# Patient Record
Sex: Female | Born: 1970
Health system: Southern US, Community
[De-identification: ages and names within clinical notes are randomized; demographics above are authoritative.]

## PROBLEM LIST (undated history)

## (undated) DIAGNOSIS — E063 Autoimmune thyroiditis: Secondary | ICD-10-CM

## (undated) DIAGNOSIS — D649 Anemia, unspecified: Secondary | ICD-10-CM

## (undated) DIAGNOSIS — I10 Essential (primary) hypertension: Secondary | ICD-10-CM

## (undated) DIAGNOSIS — E039 Hypothyroidism, unspecified: Secondary | ICD-10-CM

## (undated) HISTORY — PX: TUBAL LIGATION: SHX77

## (undated) HISTORY — PX: NASAL SINUS SURGERY: SHX719

## (undated) HISTORY — PX: CHOLECYSTECTOMY: SHX55

## (undated) HISTORY — PX: TONSILLECTOMY: SUR1361

---

## 2006-12-15 ENCOUNTER — Emergency Department: Payer: Self-pay

## 2006-12-16 ENCOUNTER — Ambulatory Visit: Payer: Self-pay | Admitting: Internal Medicine

## 2006-12-28 ENCOUNTER — Ambulatory Visit: Payer: Self-pay | Admitting: Gastroenterology

## 2007-05-03 ENCOUNTER — Ambulatory Visit: Payer: Self-pay | Admitting: Gastroenterology

## 2007-12-13 ENCOUNTER — Ambulatory Visit: Payer: Self-pay | Admitting: Internal Medicine

## 2007-12-15 ENCOUNTER — Other Ambulatory Visit: Payer: Self-pay

## 2007-12-15 ENCOUNTER — Emergency Department: Payer: Self-pay | Admitting: Emergency Medicine

## 2007-12-16 ENCOUNTER — Other Ambulatory Visit: Payer: Self-pay

## 2008-07-14 ENCOUNTER — Emergency Department: Payer: Self-pay | Admitting: Unknown Physician Specialty

## 2009-08-13 ENCOUNTER — Ambulatory Visit: Payer: Self-pay | Admitting: Gastroenterology

## 2009-11-12 ENCOUNTER — Ambulatory Visit: Payer: Self-pay | Admitting: Gastroenterology

## 2009-12-10 ENCOUNTER — Ambulatory Visit: Payer: Self-pay | Admitting: Surgery

## 2009-12-13 ENCOUNTER — Ambulatory Visit: Payer: Self-pay | Admitting: Surgery

## 2010-02-27 ENCOUNTER — Ambulatory Visit: Payer: Self-pay | Admitting: Obstetrics and Gynecology

## 2011-12-23 ENCOUNTER — Ambulatory Visit: Payer: Self-pay | Admitting: Obstetrics and Gynecology

## 2012-04-22 ENCOUNTER — Ambulatory Visit: Payer: Self-pay | Admitting: Internal Medicine

## 2012-04-22 LAB — HCG, QUANTITATIVE, PREGNANCY: Beta Hcg, Quant.: <1 m[IU]/mL — ABNORMAL LOW

## 2012-05-19 ENCOUNTER — Other Ambulatory Visit: Payer: Self-pay | Admitting: Family Medicine

## 2012-08-05 ENCOUNTER — Ambulatory Visit: Payer: Self-pay | Admitting: Internal Medicine

## 2012-08-11 ENCOUNTER — Ambulatory Visit: Payer: Self-pay | Admitting: Gastroenterology

## 2012-12-09 ENCOUNTER — Ambulatory Visit: Payer: Self-pay | Admitting: Gastroenterology

## 2012-12-23 ENCOUNTER — Ambulatory Visit: Payer: Self-pay | Admitting: Obstetrics and Gynecology

## 2013-03-08 ENCOUNTER — Ambulatory Visit: Payer: Self-pay | Admitting: Obstetrics and Gynecology

## 2013-03-08 LAB — BASIC METABOLIC PANEL
Anion Gap: 8 (ref 7–16)
BUN: 8 mg/dL (ref 7–18)
Calcium, Total: 9 mg/dL (ref 8.5–10.1)
Co2: 25 mmol/L (ref 21–32)
Creatinine: 0.68 mg/dL (ref 0.60–1.30)
EGFR (African American): >60
EGFR (Non-African Amer.): >60
Glucose: 97 mg/dL (ref 65–99)
Osmolality: 278 (ref 275–301)
Potassium: 3.8 mmol/L (ref 3.5–5.1)
Sodium: 140 mmol/L (ref 136–145)

## 2013-03-08 LAB — HEMOGLOBIN: HGB: 13.8 g/dL (ref 12.0–16.0)

## 2013-03-10 ENCOUNTER — Ambulatory Visit: Payer: Self-pay | Admitting: Obstetrics and Gynecology

## 2013-03-17 LAB — PATHOLOGY REPORT

## 2013-10-20 ENCOUNTER — Ambulatory Visit: Payer: Self-pay | Admitting: Unknown Physician Specialty

## 2013-12-11 ENCOUNTER — Other Ambulatory Visit: Payer: Self-pay | Admitting: Gastroenterology

## 2013-12-11 LAB — CLOSTRIDIUM DIFFICILE(ARMC)

## 2013-12-14 LAB — STOOL CULTURE

## 2013-12-25 ENCOUNTER — Ambulatory Visit: Payer: Self-pay | Admitting: Obstetrics and Gynecology

## 2013-12-27 ENCOUNTER — Ambulatory Visit: Payer: Self-pay | Admitting: Obstetrics and Gynecology

## 2014-03-24 ENCOUNTER — Ambulatory Visit: Payer: Self-pay | Admitting: Gastroenterology

## 2014-03-24 LAB — HCG, QUANTITATIVE, PREGNANCY: Beta Hcg, Quant.: <1 m[IU]/mL — ABNORMAL LOW

## 2014-12-21 NOTE — Op Note (Signed)
PATIENT NAME:  Jennifer Mclaughlin, Jennifer Mclaughlin MR#:  [id number] DATE OF BIRTH:  08/26/1971  DATE OF PROCEDURE:  03/10/2013  PREOPERATIVE DIAGNOSES: 1.  Menorrhagia.  2.  Elective sterilization.   POSTOPERATIVE DIAGNOSES:   1.  Menorrhagia.  2.  Elective sterilization.   PROCEDURES: 1.  Laparoscopic left salpingectomy.  2.  NovaSure endometrial ablation.   ANESTHESIA: General endotracheal anesthesia.   SURGEON:  Laverta Baltimore, MD   INDICATION:  A 44 year old gravida 4, para 1 patient with a long history of menorrhagia, with a negative workup. The patient is status post a right salpingectomy for an ectopic pregnancy in the past. She elects to have permanent sterilization.   DESCRIPTION OF PROCEDURE:  After adequate prep and drape, the patient's bladder was catheterized, yielding 100 mL of clear urine. A single-tooth tenaculum applied on the anterior cervix, and a Kahn cannula placed in the endocervical canal to be used for uterine manipulation during the procedure. A 12 mm infraumbilical incision was made after injecting with 0.5% Marcaine. The laparoscope was advanced into the abdominal cavity on direct visualization. The patient's abdomen was insufflated with carbon dioxide. A second port was placed 3 cm medial to the left anterior iliac spine. A #11 port was advanced into the abdominal cavity on direct visualization. Initial impression, patient had some scar tissue of the left colon to the left sidewall. The left fallopian tube appeared normal. There was surgical evidence of prior right salpingectomy. A third port site was advanced in the right lower quadrant. The 5 mm port was advanced into the abdominal cavity on direct visualization. Due to some technical difficulties with the Kleppinger, a Falope ring applier was brought up to the operative field, and the Falope ring was attempted to be placed on the isthmic portion of the fallopian tube. Unfortunately, this transected the fallopian tube, which required  placement of the Harmonic scalpel and removal of the left fallopian tube. Kleppinger was then fixed, and a small amount of cautery was used to control small bleeding at the salpingectomy site. The patient's abdomen was irrigated. Good hemostasis noted. Procedure was then switched to the vaginal area, where the Kahn cannula was removed. A weighted speculum was placed in the posterior vaginal vault. The cervix was dilated to a #17 Hanks dilator. The hysteroscope was advanced into the endometrial cavity. No pathology was identified. Based on the uterine sound of 8 cm and the cervical length of 4 cm, the cavity length was estimated at 4 cm. The NovaSure ablator was brought up to the operative field and advanced into the endometrial cavity. The array was opened, and this uterine width measured at 3.1 cm. A cavity assessment test was performed and passed, and based on the sounding length, cervical length, cervical width, power setting was 68. Ablation took place for 1 minute and 40 seconds without difficulty. The array was removed, and repeat hysteroscope showed normal charring effect. Good hemostasis was noted. The surgeon then returned to the  abdominal area. Again, laparoscope was performed with good hemostasis noted. Carbon dioxide was deflated, and the port sites, all trocars were removed. The infraumbilical and left lower port sites were closed with 2 layers, a 2-0 Vicryl layer and an interrupted 4-0 Vicryl skin layer. The right lower port site was closed with interrupted 4-0 Vicryl. Sterile dressings applied. There were no complications. Estimated blood loss 25 mL. Intraoperative fluids 1400 mL. The patient tolerated the procedure well, and was taken to the recovery room in good condition.    ____________________________  Boykin Nearing, MD tjs:mr D: 03/10/2013 14:44:02 ET T: 03/10/2013 21:22:23 ET JOB#: [id number]  cc: Boykin Nearing, MD, <Dictator> Boykin Nearing MD ELECTRONICALLY  SIGNED 03/12/2013 13:49

## 2015-04-19 ENCOUNTER — Emergency Department
Admission: EM | Admit: 2015-04-19 | Discharge: 2015-04-19 | Disposition: A | Discharge status: Home / Self Care | Payer: 59 | Attending: Emergency Medicine | Admitting: Emergency Medicine

## 2015-04-19 ENCOUNTER — Encounter: Payer: Self-pay | Admitting: Emergency Medicine

## 2015-04-19 DIAGNOSIS — Y9389 Activity, other specified: Secondary | ICD-10-CM | POA: Insufficient documentation

## 2015-04-19 DIAGNOSIS — Y998 Other external cause status: Secondary | ICD-10-CM | POA: Insufficient documentation

## 2015-04-19 DIAGNOSIS — Z87891 Personal history of nicotine dependence: Secondary | ICD-10-CM | POA: Insufficient documentation

## 2015-04-19 DIAGNOSIS — Y9289 Other specified places as the place of occurrence of the external cause: Secondary | ICD-10-CM | POA: Diagnosis not present

## 2015-04-19 DIAGNOSIS — Z880 Allergy status to penicillin: Secondary | ICD-10-CM | POA: Diagnosis not present

## 2015-04-19 DIAGNOSIS — W57XXXA Bitten or stung by nonvenomous insect and other nonvenomous arthropods, initial encounter: Secondary | ICD-10-CM | POA: Diagnosis not present

## 2015-04-19 DIAGNOSIS — R21 Rash and other nonspecific skin eruption: Secondary | ICD-10-CM | POA: Diagnosis present

## 2015-04-19 DIAGNOSIS — B001 Herpesviral vesicular dermatitis: Secondary | ICD-10-CM | POA: Diagnosis not present

## 2015-04-19 DIAGNOSIS — S0086XA Insect bite (nonvenomous) of other part of head, initial encounter: Secondary | ICD-10-CM | POA: Insufficient documentation

## 2015-04-19 MED ORDER — VALACYCLOVIR HCL 1 G PO TABS: 2000.0000 mg | ORAL_TABLET | Freq: Two times a day (BID) | ORAL | Status: AC

## 2015-04-19 MED ORDER — RANITIDINE HCL 150 MG PO TABS
150.0000 mg | ORAL_TABLET | Freq: Two times a day (BID) | ORAL | Status: DC
Start: 2015-04-19 — End: 2016-10-13

## 2015-04-19 NOTE — ED Notes (Signed)
Pt states rash on left cheek and left arm since this AM, pt denies itching or burning but states its sore

## 2015-04-19 NOTE — Discharge Instructions (Signed)
Cold Sore A cold sore (fever blister) is a skin infection caused by the herpes simplex virus (HSV-1). HSV-1 is closely related to the virus that causes genital herpes (HSV-2), but they are not the same even though both viruses can cause oral and genital infections. Cold sores are small, fluid-filled sores inside of the mouth or on the lips, gums, nose, chin, cheeks, or fingers.  The herpes simplex virus can be easily passed (contagious) to other people through close personal contact, such as kissing or sharing personal items. The virus can also spread to other parts of the body, such as the eyes or genitals. Cold sores are contagious until the sores crust over completely. They often heal within 2 weeks.  Once a person is infected, the herpes simplex virus remains permanently in the body. Therefore, there is no cure for cold sores, and they often recur when a person is tired, stressed, sick, or gets too much sun. Additional factors that can cause a recurrence include hormone changes in menstruation or pregnancy, certain drugs, and cold weather.  CAUSES  Cold sores are caused by the herpes simplex virus. The virus is spread from person to person through close contact, such as through kissing, touching the affected area, or sharing personal items such as lip balm, razors, or eating utensils.  SYMPTOMS  The first infection may not cause symptoms. If symptoms develop, the symptoms often go through different stages. Here is how a cold sore develops:   Tingling, itching, or burning is felt 1-2 days before the outbreak.   Fluid-filled blisters appear on the lips, inside the mouth, nose, or on the cheeks.   The blisters start to ooze clear fluid.   The blisters dry up and a yellow crust appears in its place.   The crust falls off.  Symptoms depend on whether it is the initial outbreak or a recurrence. Some other symptoms with the first outbreak may include:   Fever.   Sore throat.   Headache.    Muscle aches.   Swollen neck glands.  DIAGNOSIS  A diagnosis is often made based on your symptoms and looking at the sores. Sometimes, a sore may be swabbed and then examined in the lab to make a final diagnosis. If the sores are not present, blood tests can find the herpes simplex virus.  TREATMENT  There is no cure for cold sores and no vaccine for the herpes simplex virus. Within 2 weeks, most cold sores go away on their own without treatment. Medicines cannot make the infection go away, but medicine can help relieve some of the pain associated with the sores, can work to stop the virus from multiplying, and can also shorten healing time. Medicine may be in the form of creams, gels, pills, or a shot.  HOME CARE INSTRUCTIONS   Only take over-the-counter or prescription medicines for pain, discomfort, or fever as directed by your caregiver. Do not use aspirin.   Use a cotton-tip swab to apply creams or gels to your sores.   Do not touch the sores or pick the scabs. Wash your hands often. Do not touch your eyes without washing your hands first.   Avoid kissing, oral sex, and sharing personal items until sores heal.   Apply an ice pack on your sores for 10-15 minutes to ease any discomfort.   Avoid hot, cold, or salty foods because they may hurt your mouth. Eat a soft, bland diet to avoid irritating the sores. Use a straw to drink   if you have pain when drinking out of a glass.   Keep sores clean and dry to prevent an infection of other tissues.   Avoid the sun and limit stress if these things trigger outbreaks. If sun causes cold sores, apply sunscreen on the lips before being out in the sun.  SEEK MEDICAL CARE IF:   You have a fever or persistent symptoms for more than 2-3 days.   You have a fever and your symptoms suddenly get worse.   You have pus, not clear fluid, coming from the sores.   You have redness that is spreading.   You have pain or irritation in your  eye.   You get sores on your genitals.   Your sores do not heal within 2 weeks.   You have a weakened immune system.   You have frequent recurrences of cold sores.  MAKE SURE YOU:   Understand these instructions.  Will watch your condition.  Will get help right away if you are not doing well or get worse. Document Released: 08/14/2000 Document Revised: 01/01/2014 Document Reviewed: 12/30/2011 [facility] Patient Information 2015 [place], [place]. This information is not intended to replace advice given to you by your health care provider. Make sure you discuss any questions you have with your health care provider.  Herpes Labialis You have a fever blister or cold sore (herpes labialis). These painful, grouped sores are caused by one of the herpes viruses (HSV1 most commonly). They are usually found around the lips and mouth, but the same infection can also affect other areas on the face such as the nose and eyes. Herpes infections take about 10 days to heal. They often occur again and again in the same spot. Other symptoms may include numbness and tingling in the involved skin, achiness, fever, and swollen glands in the neck. Colds, emotional stress, injuries, or excess sunlight exposure all seem to make herpes reappear. Herpes lip infections are contagious. Direct contact with these sores can spread the infection. It can also be spread to other parts of your own body. TREATMENT  Herpes labialis is usually self-limited and resolves within 1 week. To reduce pain and swelling, apply ice packs frequently to the sores or suck on popsicles or frozen juice bars. Antiviral medicine may be used by mouth to shorten the duration of the breakout. Avoid spreading the infection by washing your hands often. Be careful not to touch your eyes or genital areas after handling the infected blisters. Do not kiss or have other intimate contact with others. After the blisters are completely healed you may resume  contact. Use sunscreen to lessen recurrences.  If this is your first infection with herpes, or if you have a severe or repeated infections, your caregiver may prescribe one of the anti-viral drugs to speed up the healing. If you have sun-related flare-ups despite the use of sunscreen, starting oral anti-viral medicine before a prolonged exposure (going skiing or to the beach) can prevent most episodes.  SEEK IMMEDIATE MEDICAL CARE IF:  You develop a headache, sleepiness, high fever, vomiting, or severe weakness.  You have eye irritation, pain, blurred vision or redness.  You develop a prolonged infection not getting better in 10 days. Document Released: 08/17/2005 Document Revised: 11/09/2011 Document Reviewed: 06/21/2009 [facility] Patient Information 2015 [place], [place]. This information is not intended to replace advice given to you by your health care provider. Make sure you discuss any questions you have with your health care provider.  Take the prescription meds  as directed. Apply cortisone cream to the face and arm. Start Benadryl and ranitidine as directed.

## 2015-04-19 NOTE — ED Notes (Signed)
Pt states she has some red raised bumps on left side of face and left arm, is not sure where the rash came from, states it itches.

## 2015-04-19 NOTE — ED Provider Notes (Signed)
[facility] Emergency Department Provider Note ____________________________________________  Time seen: 1335  I have reviewed the triage vital signs and the nursing notes.  HISTORY  Chief Complaint  Rash  HPI Jennifer Mclaughlin is a 44 y.o. female reports to the ED for evaluation of several different skin lesions since returning from the beach on Sunday. She describes an area to her left cheek that she thought was potentially a blackhead, so she squeezed it the day before. Now she is noted to have some increased swelling and tenderness to the left cheek. She also is aware of some blisters to the lower lip at the vermilion border. She denies painful or itchy. She is also noted to have just a few scattered small bumps to the left forearm. She denies that they are itchy, but reports that she is constantly scratching at them because she notes they're there. Overall she's not had any exposures, allergens, rashes, other known contacts at this time.  History reviewed. No pertinent past medical history.  There are no active problems to display for this patient.  Past Surgical History  Procedure Laterality Date  . Cholecystectomy    . Tonsillectomy    . Nasal sinus surgery      Current Outpatient Rx  Name  Route  Sig  Dispense  Refill  . ranitidine (ZANTAC) 150 MG tablet   Oral   Take 1 tablet (150 mg total) by mouth 2 (two) times daily.   20 tablet   0   . valACYclovir (VALTREX) 1000 MG tablet   Oral   Take 2 tablets (2,000 mg total) by mouth 2 (two) times daily.   4 tablet   0    Allergies Biaxin; Ceftin; Penicillins; Septra; and Sulfa antibiotics  No family history on file.  Social History Social History  Substance Use Topics  . Smoking status: Former Research scientist (life sciences)  . Smokeless tobacco: None  . Alcohol Use: Yes     Comment: occas   Review of Systems  Constitutional: Negative for fever. Eyes: Negative for visual changes. ENT: Negative for sore  throat. Cardiovascular: Negative for chest pain. Respiratory: Negative for shortness of breath. Gastrointestinal: Negative for abdominal pain, vomiting and diarrhea. Genitourinary: Negative for dysuria. Musculoskeletal: Negative for back pain. Skin: Negative for rash. Skin changes as above Neurological: Negative for headaches, focal weakness or numbness. ____________________________________________  PHYSICAL EXAM:  VITAL SIGNS: ED Triage Vitals  Enc Vitals Group     BP 04/19/15 1201 135/95 mmHg     Pulse Rate 04/19/15 1201 99     Resp 04/19/15 1201 18     Temp 04/19/15 1201 97.5 F (36.4 C)     Temp Source 04/19/15 1201 Oral     SpO2 04/19/15 1201 96 %     Weight 04/19/15 1154 170 lb (77.111 kg)     Height 04/19/15 1154 5\' 3"  (1.6 m)     Head Cir --      Peak Flow --      Pain Score 04/19/15 1154 0     Pain Loc --      Pain Edu? --      Excl. in [place]? --    Constitutional: Alert and oriented. Well appearing and in no distress. Eyes: Conjunctivae are normal. PERRL. Normal extraocular movements. ENT   Head: Normocephalic and atraumatic. Left cheek with local area of skin induration and peau d'orange appearance. No fluctuance or discharge noted.   Nose: No congestion/rhinnorhea.   Mouth/Throat: Mucous membranes are moist.  Lower lip with grouped vesicles to the lateral vermilion border.    Neck: Supple. No thyromegaly. Hematological/Lymphatic/Immunilogical: No cervical lymphadenopathy. Cardiovascular: Normal rate, regular rhythm.  Respiratory: Normal respiratory effort. No wheezes/rales/rhonchi. Gastrointestinal: Soft and nontender. No distention. Musculoskeletal: Nontender with normal range of motion in all extremities.  Neurologic:  Normal gait without ataxia. Normal speech and language. No gross focal neurologic deficits are appreciated. Skin:  Skin is warm, dry and intact. 3-4 small papules to the left proximal forearm. No excoriations, burrows, or discharge  noted. Psychiatric: Mood and affect are normal. Patient exhibits appropriate insight and judgment. ____________________________________________  INITIAL IMPRESSION / ASSESSMENT AND PLAN / ED COURSE  Herpes labialis Insect bite left cheek dermatitisleft forearm ____________________________________________  FINAL CLINICAL IMPRESSION(S) / ED DIAGNOSES  Final diagnoses:  Herpes labialis  Insect bite      Melvenia Needles, PA-C 04/21/15 2205  Hinda Kehr, MD 04/21/15 2350

## 2015-04-30 ENCOUNTER — Other Ambulatory Visit: Payer: Self-pay | Admitting: Obstetrics and Gynecology

## 2015-04-30 DIAGNOSIS — Z1231 Encounter for screening mammogram for malignant neoplasm of breast: Secondary | ICD-10-CM

## 2015-05-07 ENCOUNTER — Ambulatory Visit
Admission: RE | Admit: 2015-05-07 | Discharge: 2015-05-07 | Disposition: A | Discharge status: Home / Self Care | Payer: 59 | Source: Ambulatory Visit | Attending: Obstetrics and Gynecology | Admitting: Obstetrics and Gynecology

## 2015-05-07 DIAGNOSIS — Z1231 Encounter for screening mammogram for malignant neoplasm of breast: Secondary | ICD-10-CM | POA: Insufficient documentation

## 2015-05-08 ENCOUNTER — Ambulatory Visit: Payer: 59

## 2015-06-07 ENCOUNTER — Ambulatory Visit: Payer: Self-pay | Admitting: Physician Assistant

## 2015-06-07 ENCOUNTER — Encounter: Payer: Self-pay | Admitting: Physician Assistant

## 2015-06-07 VITALS — BP 120/80 | Temp 98.4°F | Wt 187.0 lb

## 2015-06-07 DIAGNOSIS — H5711 Ocular pain, right eye: Secondary | ICD-10-CM

## 2015-06-07 DIAGNOSIS — H5789 Other specified disorders of eye and adnexa: Secondary | ICD-10-CM

## 2015-06-07 NOTE — Progress Notes (Signed)
S acute onset of R eye pain/ tenderness laterally , no LOV , photophobia or acute ENT sxs, + allergies and takes zyrtec, No h/o FB but has FB sensation. No drainage Does not wear glasses or contacs  O/ alert NAD pleasant , perrla, eoms full, fundi benign   Conjunctivae and sclerae clear  R outer canthous with white clear ? Inclusion cyst   A/ R eye pain with ? Inclusion cyst  P / Referral to [facility] and pt will be seen now.

## 2015-09-06 ENCOUNTER — Emergency Department: Payer: 59

## 2015-09-06 ENCOUNTER — Emergency Department
Admission: EM | Admit: 2015-09-06 | Discharge: 2015-09-06 | Disposition: A | Discharge status: Home / Self Care | Payer: 59 | Attending: Emergency Medicine | Admitting: Emergency Medicine

## 2015-09-06 ENCOUNTER — Encounter: Payer: Self-pay | Admitting: Emergency Medicine

## 2015-09-06 DIAGNOSIS — Z79899 Other long term (current) drug therapy: Secondary | ICD-10-CM | POA: Insufficient documentation

## 2015-09-06 DIAGNOSIS — R079 Chest pain, unspecified: Secondary | ICD-10-CM | POA: Diagnosis not present

## 2015-09-06 DIAGNOSIS — Z880 Allergy status to penicillin: Secondary | ICD-10-CM | POA: Insufficient documentation

## 2015-09-06 DIAGNOSIS — Z87891 Personal history of nicotine dependence: Secondary | ICD-10-CM | POA: Insufficient documentation

## 2015-09-06 DIAGNOSIS — R0789 Other chest pain: Secondary | ICD-10-CM | POA: Diagnosis not present

## 2015-09-06 LAB — CBC
HEMATOCRIT: 46.2 % (ref 35.0–47.0)
Hemoglobin: 15.3 g/dL (ref 12.0–16.0)
MCH: 29.5 pg (ref 26.0–34.0)
MCHC: 33.2 g/dL (ref 32.0–36.0)
MCV: 89 fL (ref 80.0–100.0)
PLATELETS: 292 10*3/uL (ref 150–440)
RBC: 5.19 MIL/uL (ref 3.80–5.20)
RDW: 12.9 % (ref 11.5–14.5)
WBC: 11.5 10*3/uL — AB (ref 3.6–11.0)

## 2015-09-06 LAB — BASIC METABOLIC PANEL
Anion gap: 9 (ref 5–15)
BUN: 12 mg/dL (ref 6–20)
CALCIUM: 9.8 mg/dL (ref 8.9–10.3)
CHLORIDE: 104 mmol/L (ref 101–111)
CO2: 28 mmol/L (ref 22–32)
CREATININE: 0.63 mg/dL (ref 0.44–1.00)
Glucose, Bld: 105 mg/dL — ABNORMAL HIGH (ref 65–99)
Potassium: 3.4 mmol/L — ABNORMAL LOW (ref 3.5–5.1)
SODIUM: 141 mmol/L (ref 135–145)

## 2015-09-06 LAB — TROPONIN I: Troponin I: <0.03 ng/mL (ref <0.031)

## 2015-09-06 LAB — FIBRIN DERIVATIVES D-DIMER (ARMC ONLY): FIBRIN DERIVATIVES D-DIMER (ARMC): 269 (ref 0–499)

## 2015-09-06 MED ORDER — MORPHINE SULFATE (PF) 2 MG/ML IV SOLN
2.0000 mg | Freq: Once | INTRAVENOUS | Status: AC
Start: 2015-09-06 — End: 2015-09-06
  Administered 2015-09-06: 2 mg via INTRAMUSCULAR
  Filled 2015-09-06: qty 1

## 2015-09-06 NOTE — ED Notes (Addendum)
States she developed upper chest pain which radiates into back  Describes pain as sharp..pain started at 6 40 am and has become worse   No relief with taking prilosec

## 2015-09-06 NOTE — ED Provider Notes (Signed)
[facility] Emergency Department Provider Note  ____________________________________________  Time seen: Approximately 12:41 PM  I have reviewed the triage vital signs and the nursing notes.   HISTORY  Chief Complaint Chest Pain    HPI Jennifer Mclaughlin is a 45 y.o. female she reports she was at work and had onset of what she thought was indigestion pain in the left upper chest at 640 this morning came on suddenly and got worse again radiating into the back patient had a brief period where she had tingling down the left arm that went away. Patient reports she took a Nexium but that did not help she was not short of breath with it there was no nausea or sweating she did not have any pleuritic, content to the pain. She has never had this kind of pain before. The pain is still present now but it is much better. At its worst the pain was sharp perhaps burning.   History reviewed. No pertinent past medical history.  Patient occasionally takes Nexium or Zantac  There are no active problems to display for this patient.   Past Surgical History  Procedure Laterality Date  . Cholecystectomy    . Tonsillectomy    . Nasal sinus surgery      Current Outpatient Rx  Name  Route  Sig  Dispense  Refill  . cetirizine (ZYRTEC) 10 MG tablet   Oral   Take 10 mg by mouth daily.         . ranitidine (ZANTAC) 150 MG tablet   Oral   Take 1 tablet (150 mg total) by mouth 2 (two) times daily.   20 tablet   0     Allergies Biaxin; Ceftin; Penicillins; Septra; and Sulfa antibiotics  No family history on file.  Social History Social History  Substance Use Topics  . Smoking status: Former Research scientist (life sciences)  . Smokeless tobacco: None  . Alcohol Use: 0.0 oz/week    0 Standard drinks or equivalent per week     Comment: occas    Review of Systems Constitutional: No fever/chills Eyes: No visual changes. ENT: No sore throat. Cardiovascular: See history of present  illness Respiratory: Denies shortness of breath. Gastrointestinal: No abdominal pain.  No nausea, no vomiting.  No diarrhea.  No constipation. Genitourinary: Negative for dysuria. Musculoskeletal: Negative for back pain. Skin: Negative for rash. Neurological: Negative for headaches, focal weakness or numbness.  10-point ROS otherwise negative.  ____________________________________________   PHYSICAL EXAM:  VITAL SIGNS: ED Triage Vitals  Enc Vitals Group     BP 09/06/15 1038 158/106 mmHg     Pulse Rate 09/06/15 1038 98     Resp 09/06/15 1038 18     Temp 09/06/15 1038 97.5 F (36.4 C)     Temp Source 09/06/15 1038 Oral     SpO2 09/06/15 1038 99 %     Weight 09/06/15 1038 187 lb (84.823 kg)     Height 09/06/15 1038 5\' 3"  (1.6 m)     Head Cir --      Peak Flow --      Pain Score 09/06/15 1039 9     Pain Loc --      Pain Edu? --      Excl. in [place]? --     Constitutional: Alert and oriented. Well appearing and in no acute distress. Eyes: Conjunctivae are normal. PERRL. EOMI. Head: Atraumatic. Nose: No congestion/rhinnorhea. Mouth/Throat: Mucous membranes are moist.  Oropharynx non-erythematous. Neck: No stridor.  Cardiovascular:  Normal rate, regular rhythm. Grossly normal heart sounds.  Good peripheral circulation. Respiratory: Normal respiratory effort.  No retractions. Lungs CTAB. Gastrointestinal: Soft and nontender. No distention. No abdominal bruits. No CVA tenderness. Musculoskeletal: No lower extremity tenderness nor edema.  No joint effusions. Neurologic:  Normal speech and language. No gross focal neurologic deficits are appreciated. No gait instability. Skin:  Skin is warm, dry and intact. No rash noted. Psychiatric: Mood and affect are normal. Speech and behavior are normal.  ____________________________________________   LABS (all labs ordered are listed, but only abnormal results are displayed)  Labs Reviewed  BASIC METABOLIC PANEL - Abnormal; Notable for  the following:    Potassium 3.4 (*)    Glucose, Bld 105 (*)    All other components within normal limits  CBC - Abnormal; Notable for the following:    WBC 11.5 (*)    All other components within normal limits  TROPONIN I  TROPONIN I  FIBRIN DERIVATIVES D-DIMER ([facility] ONLY)   ____________________________________________  EKG EKG read and interpreted by me shows sinus tachycardia at a rate of 104 normal axis some decreased R-wave progression but otherwise a normal EKG EKG #2 read and interpreted by me shows normal sinus rhythm at a rate of 81 normal axis essentially very similar to EKG #1 ____________________________________________  RADIOLOGY  Radiology reads the chest x-ray is normal ____________________________________________   PROCEDURES    ____________________________________________   INITIAL IMPRESSION / ASSESSMENT AND PLAN / ED COURSE  Pertinent labs & imaging results that were available during my care of the patient were reviewed by me and considered in my medical decision making (see chart for details).   ____________________________________________   FINAL CLINICAL IMPRESSION(S) / ED DIAGNOSES  Final diagnoses:  Chest pain, unspecified chest pain type      Nena Polio, MD 09/06/15 2134

## 2015-09-10 DIAGNOSIS — E7800 Pure hypercholesterolemia, unspecified: Secondary | ICD-10-CM | POA: Diagnosis not present

## 2015-09-10 DIAGNOSIS — I10 Essential (primary) hypertension: Secondary | ICD-10-CM | POA: Diagnosis not present

## 2015-09-10 DIAGNOSIS — R079 Chest pain, unspecified: Secondary | ICD-10-CM | POA: Diagnosis not present

## 2015-09-17 DIAGNOSIS — R079 Chest pain, unspecified: Secondary | ICD-10-CM | POA: Diagnosis not present

## 2015-10-02 DIAGNOSIS — E039 Hypothyroidism, unspecified: Secondary | ICD-10-CM | POA: Diagnosis not present

## 2015-10-02 DIAGNOSIS — E538 Deficiency of other specified B group vitamins: Secondary | ICD-10-CM | POA: Diagnosis not present

## 2015-10-02 DIAGNOSIS — Z13220 Encounter for screening for lipoid disorders: Secondary | ICD-10-CM | POA: Diagnosis not present

## 2015-10-02 DIAGNOSIS — Z79899 Other long term (current) drug therapy: Secondary | ICD-10-CM | POA: Diagnosis not present

## 2015-10-02 DIAGNOSIS — D519 Vitamin B12 deficiency anemia, unspecified: Secondary | ICD-10-CM | POA: Diagnosis not present

## 2015-10-09 ENCOUNTER — Other Ambulatory Visit: Payer: Self-pay | Admitting: Internal Medicine

## 2015-10-09 DIAGNOSIS — R1031 Right lower quadrant pain: Secondary | ICD-10-CM

## 2015-10-09 DIAGNOSIS — Z0000 Encounter for general adult medical examination without abnormal findings: Secondary | ICD-10-CM | POA: Diagnosis not present

## 2015-10-09 DIAGNOSIS — E039 Hypothyroidism, unspecified: Secondary | ICD-10-CM | POA: Diagnosis not present

## 2015-10-09 DIAGNOSIS — Z23 Encounter for immunization: Secondary | ICD-10-CM | POA: Diagnosis not present

## 2015-10-09 DIAGNOSIS — I10 Essential (primary) hypertension: Secondary | ICD-10-CM | POA: Diagnosis not present

## 2015-10-09 DIAGNOSIS — E7800 Pure hypercholesterolemia, unspecified: Secondary | ICD-10-CM | POA: Diagnosis not present

## 2015-10-11 ENCOUNTER — Emergency Department
Admission: EM | Admit: 2015-10-11 | Discharge: 2015-10-11 | Disposition: A | Discharge status: Home / Self Care | Payer: 59 | Attending: Emergency Medicine | Admitting: Emergency Medicine

## 2015-10-11 ENCOUNTER — Encounter: Payer: Self-pay | Admitting: Emergency Medicine

## 2015-10-11 DIAGNOSIS — Z880 Allergy status to penicillin: Secondary | ICD-10-CM | POA: Insufficient documentation

## 2015-10-11 DIAGNOSIS — B9689 Other specified bacterial agents as the cause of diseases classified elsewhere: Secondary | ICD-10-CM

## 2015-10-11 DIAGNOSIS — Z792 Long term (current) use of antibiotics: Secondary | ICD-10-CM | POA: Diagnosis not present

## 2015-10-11 DIAGNOSIS — T814XXA Infection following a procedure, initial encounter: Secondary | ICD-10-CM | POA: Insufficient documentation

## 2015-10-11 DIAGNOSIS — Z79899 Other long term (current) drug therapy: Secondary | ICD-10-CM | POA: Insufficient documentation

## 2015-10-11 DIAGNOSIS — T8089XA Other complications following infusion, transfusion and therapeutic injection, initial encounter: Secondary | ICD-10-CM | POA: Diagnosis not present

## 2015-10-11 DIAGNOSIS — T8090XA Unspecified complication following infusion and therapeutic injection, initial encounter: Secondary | ICD-10-CM

## 2015-10-11 DIAGNOSIS — Y658 Other specified misadventures during surgical and medical care: Secondary | ICD-10-CM | POA: Diagnosis not present

## 2015-10-11 DIAGNOSIS — A499 Bacterial infection, unspecified: Secondary | ICD-10-CM | POA: Diagnosis not present

## 2015-10-11 DIAGNOSIS — L089 Local infection of the skin and subcutaneous tissue, unspecified: Secondary | ICD-10-CM | POA: Diagnosis not present

## 2015-10-11 DIAGNOSIS — Z87891 Personal history of nicotine dependence: Secondary | ICD-10-CM | POA: Diagnosis not present

## 2015-10-11 MED ORDER — CLINDAMYCIN HCL 150 MG PO CAPS
150.0000 mg | ORAL_CAPSULE | Freq: Once | ORAL | Status: AC
  Administered 2015-10-11: 150 mg via ORAL
  Filled 2015-10-11: qty 1

## 2015-10-11 MED ORDER — IBUPROFEN 600 MG PO TABS: 600.0000 mg | ORAL_TABLET | Freq: Three times a day (TID) | ORAL | Status: DC | PRN

## 2015-10-11 MED ORDER — CLINDAMYCIN HCL 150 MG PO CAPS: 150.0000 mg | ORAL_CAPSULE | Freq: Four times a day (QID) | ORAL | Status: DC

## 2015-10-11 MED ORDER — TRAMADOL HCL 50 MG PO TABS
50.0000 mg | ORAL_TABLET | Freq: Once | ORAL | Status: AC
  Administered 2015-10-11: 50 mg via ORAL
  Filled 2015-10-11: qty 1

## 2015-10-11 MED ORDER — IBUPROFEN 800 MG PO TABS
800.0000 mg | ORAL_TABLET | Freq: Once | ORAL | Status: AC
  Administered 2015-10-11: 800 mg via ORAL
  Filled 2015-10-11: qty 1

## 2015-10-11 MED ORDER — TRAMADOL HCL 50 MG PO TABS: 50.0000 mg | ORAL_TABLET | Freq: Four times a day (QID) | ORAL | Status: DC | PRN

## 2015-10-11 NOTE — ED Provider Notes (Signed)
[facility] Emergency Department Provider Note  ____________________________________________  Time seen: Approximately 11:18 PM  I have reviewed the triage vital signs and the nursing notes.   HISTORY  Chief Complaint Wound Check    HPI Jennifer Mclaughlin is a 45 y.o. female patient complaining of left deltoid edema or erythema and pain. Patient receives a tetanus shot 2 days ago. She denies any fever associated this complaint. Patient states increased pain with abduction and overhead reaching. No palliative measures taken for this complaint.States rates the pain as a 3/10.   History reviewed. No pertinent past medical history.  There are no active problems to display for this patient.   Past Surgical History  Procedure Laterality Date  . Cholecystectomy    . Tonsillectomy    . Nasal sinus surgery      Current Outpatient Rx  Name  Route  Sig  Dispense  Refill  . cetirizine (ZYRTEC) 10 MG tablet   Oral   Take 10 mg by mouth daily.         . clindamycin (CLEOCIN) 150 MG capsule   Oral   Take 1 capsule (150 mg total) by mouth 4 (four) times daily.   40 capsule   0   . ibuprofen (ADVIL,MOTRIN) 600 MG tablet   Oral   Take 1 tablet (600 mg total) by mouth every 8 (eight) hours as needed.   15 tablet   0   . ranitidine (ZANTAC) 150 MG tablet   Oral   Take 1 tablet (150 mg total) by mouth 2 (two) times daily.   20 tablet   0   . traMADol (ULTRAM) 50 MG tablet   Oral   Take 1 tablet (50 mg total) by mouth every 6 (six) hours as needed for moderate pain.   12 tablet   0     Allergies Biaxin; Ceftin; Penicillins; Septra; and Sulfa antibiotics  No family history on file.  Social History Social History  Substance Use Topics  . Smoking status: Former Research scientist (life sciences)  . Smokeless tobacco: None  . Alcohol Use: 0.0 oz/week    0 Standard drinks or equivalent per week     Comment: occas    Review of Systems Constitutional: No fever/chills Eyes:  No visual changes. ENT: No sore throat. Cardiovascular: Denies chest pain. Respiratory: Denies shortness of breath. Gastrointestinal: No abdominal pain.  No nausea, no vomiting.  No diarrhea.  No constipation. Genitourinary: Negative for dysuria. Musculoskeletal: Negative for back pain. Skin: Negative for rash. Redness and swelling to the left deltoid. Neurological: Negative for headaches, focal weakness or numbness. Allergic/Immunilogical: Medication list  10-point ROS otherwise negative.  ____________________________________________   PHYSICAL EXAM:  VITAL SIGNS: ED Triage Vitals  Enc Vitals Group     BP --      Pulse --      Resp --      Temp --      Temp src --      SpO2 --      Weight --      Height --      Head Cir --      Peak Flow --      Pain Score 10/11/15 2311 3     Pain Loc --      Pain Edu? --      Excl. in [place]? --     Constitutional: Alert and oriented. Well appearing and in no acute distress. Eyes: Conjunctivae are normal. PERRL. EOMI. Head: Atraumatic. Nose:  No congestion/rhinnorhea. Mouth/Throat: Mucous membranes are moist.  Oropharynx non-erythematous. Neck: No stridor.  No cervical spine tenderness to palpation. Hematological/Lymphatic/Immunilogical: No cervical lymphadenopathy. ardiovascular: Normal rate, regular rhythm. Grossly normal heart sounds.  Good peripheral circulation. Respiratory: Normal respiratory effort.  No retractions. Lungs CTAB. Gastrointestinal: Soft and nontender. No distention. No abdominal bruits. No CVA tenderness. Musculoskeletal: No lower extremity tenderness nor edema.  No joint effusions. Neurologic:  Normal speech and language. No gross focal neurologic deficits are appreciated. No gait instability. Skin:  Skin is warm, dry and intact. No rash noted. Edema erythema to the left deltoid. Psychiatric: Mood and affect are normal. Speech and behavior are normal.  ____________________________________________   LABS (all  labs ordered are listed, but only abnormal results are displayed)  Labs Reviewed - No data to display ____________________________________________  EKG   ____________________________________________  RADIOLOGY   ____________________________________________   PROCEDURES  Procedure(s) performed: None  Critical Care performed: No  ____________________________________________   INITIAL IMPRESSION / ASSESSMENT AND PLAN / ED COURSE  Pertinent labs & imaging results that were available during my care of the patient were reviewed by me and considered in my medical decision making (see chart for details).  Injection site reaction with mild cellulitis. Patient get a prescription for clindamycin, tramadol, ibuprofen. Patient given discharge care instructions. Patient advised follow-up family doctor if no improvement in 2-3 days. Return to ER if condition worsens. ____________________________________________   FINAL CLINICAL IMPRESSION(S) / ED DIAGNOSES  Final diagnoses:  Injection site reaction, initial encounter  Bacterial skin infection      Sable Feil, PA-C 10/11/15 2321  Daymon Larsen, MD 10/11/15 2330

## 2015-10-11 NOTE — ED Notes (Addendum)
Pt had tetanus shot on Left deltoid Wednesday and has swelling and redness noted to area. Area warm to touch. Pt c/o soreness. Pt denies fever

## 2015-10-16 DIAGNOSIS — E538 Deficiency of other specified B group vitamins: Secondary | ICD-10-CM | POA: Diagnosis not present

## 2015-10-21 ENCOUNTER — Ambulatory Visit
Admission: RE | Admit: 2015-10-21 | Discharge: 2015-10-21 | Disposition: A | Discharge status: Home / Self Care | Payer: 59 | Source: Ambulatory Visit | Attending: Internal Medicine | Admitting: Internal Medicine

## 2015-10-21 DIAGNOSIS — R1031 Right lower quadrant pain: Secondary | ICD-10-CM | POA: Diagnosis not present

## 2015-10-21 DIAGNOSIS — K769 Liver disease, unspecified: Secondary | ICD-10-CM | POA: Insufficient documentation

## 2015-10-21 DIAGNOSIS — R109 Unspecified abdominal pain: Secondary | ICD-10-CM | POA: Diagnosis not present

## 2015-10-21 MED ORDER — IOHEXOL 300 MG/ML  SOLN
100.0000 mL | Freq: Once | INTRAMUSCULAR | Status: AC | PRN
  Administered 2015-10-21: 100 mL via INTRAVENOUS

## 2015-12-20 ENCOUNTER — Encounter: Payer: Self-pay | Admitting: Physician Assistant

## 2015-12-20 ENCOUNTER — Ambulatory Visit: Payer: Self-pay | Admitting: Physician Assistant

## 2015-12-20 VITALS — BP 140/90 | HR 78 | Temp 98.6°F

## 2015-12-20 DIAGNOSIS — M549 Dorsalgia, unspecified: Secondary | ICD-10-CM

## 2015-12-20 MED ORDER — CYCLOBENZAPRINE HCL 10 MG PO TABS: 10.0000 mg | ORAL_TABLET | Freq: Three times a day (TID) | ORAL | Status: DC | PRN

## 2015-12-20 MED ORDER — METHYLPREDNISOLONE 4 MG PO TBPK: ORAL_TABLET | ORAL | Status: DC

## 2015-12-20 NOTE — Progress Notes (Signed)
S: c/o low back pain and pain radiating from hip to knee, hurts worse in the mornings and if on it for awhile, no known injury, can push on area at top of thigh and beside knee which makes it hurt, denies numbness / tingling  O: vitals wnl, nad, spine and paravertebral muscles nontender, si joint a little tender, IT band that runs from hip to knee is tight and tender, full rom, no foot drop, n/v intact  A: acute back pain secondary to IT band inflammation  P: medrol dose pack, flexeril

## 2016-04-14 DIAGNOSIS — I10 Essential (primary) hypertension: Secondary | ICD-10-CM | POA: Diagnosis not present

## 2016-04-14 DIAGNOSIS — E538 Deficiency of other specified B group vitamins: Secondary | ICD-10-CM | POA: Diagnosis not present

## 2016-04-14 DIAGNOSIS — E039 Hypothyroidism, unspecified: Secondary | ICD-10-CM | POA: Diagnosis not present

## 2016-04-14 DIAGNOSIS — Z79899 Other long term (current) drug therapy: Secondary | ICD-10-CM | POA: Diagnosis not present

## 2016-04-14 DIAGNOSIS — E7800 Pure hypercholesterolemia, unspecified: Secondary | ICD-10-CM | POA: Diagnosis not present

## 2016-04-14 DIAGNOSIS — K580 Irritable bowel syndrome with diarrhea: Secondary | ICD-10-CM | POA: Diagnosis not present

## 2016-04-16 DIAGNOSIS — M222X1 Patellofemoral disorders, right knee: Secondary | ICD-10-CM | POA: Diagnosis not present

## 2016-04-16 DIAGNOSIS — M25561 Pain in right knee: Secondary | ICD-10-CM | POA: Diagnosis not present

## 2016-05-28 ENCOUNTER — Other Ambulatory Visit: Payer: Self-pay | Admitting: Obstetrics and Gynecology

## 2016-05-28 ENCOUNTER — Other Ambulatory Visit: Payer: Self-pay | Admitting: Internal Medicine

## 2016-05-28 DIAGNOSIS — Z1231 Encounter for screening mammogram for malignant neoplasm of breast: Secondary | ICD-10-CM

## 2016-05-29 ENCOUNTER — Encounter: Payer: Self-pay | Admitting: Physician Assistant

## 2016-05-29 ENCOUNTER — Ambulatory Visit: Payer: Self-pay | Admitting: Physician Assistant

## 2016-05-29 VITALS — BP 126/82 | HR 82 | Temp 98.5°F

## 2016-05-29 DIAGNOSIS — J0100 Acute maxillary sinusitis, unspecified: Secondary | ICD-10-CM

## 2016-05-29 DIAGNOSIS — M5442 Lumbago with sciatica, left side: Secondary | ICD-10-CM

## 2016-05-29 MED ORDER — CYCLOBENZAPRINE HCL 10 MG PO TABS: 10.0000 mg | ORAL_TABLET | Freq: Three times a day (TID) | ORAL | 0 refills | Status: DC | PRN

## 2016-05-29 MED ORDER — AZITHROMYCIN 250 MG PO TABS: ORAL_TABLET | ORAL | 0 refills | Status: DC

## 2016-05-29 MED ORDER — METHYLPREDNISOLONE 4 MG PO TBPK: ORAL_TABLET | ORAL | 0 refills | Status: DC

## 2016-05-29 NOTE — Progress Notes (Signed)
S: C/o runny nose and congestion for 14 days, no fever, chills, cp/sob, v/d; mucus is green and thick, cough is sporadic, c/o of facial and dental pain. Also sciatica is acting up, has been hurting for about 3 weeks, took multiple otc meds without relief, pain radiates down left leg, no changes in bowel patterns  Using otc meds:   O: PE: vitals wnl, nad,  perrl eomi, normocephalic, tms dull, nasal mucosa red and swollen, throat injected, neck supple no lymph, lungs c t a, cv rrr, neuro intact, lumbar spine nontender, full rom, able to walk on toes and heels without difficulty, neg slr  A:  Acute sinusitis, acute sciatica   P: drink fluids, continue regular meds , use otc meds of choice, return if not improving in 5 days, return earlier if worsening , zpack, medrol dose pack, flexeril, pt states even though she can't take biaxin she does well on zpack

## 2016-06-15 ENCOUNTER — Other Ambulatory Visit: Payer: Self-pay | Admitting: Internal Medicine

## 2016-06-15 DIAGNOSIS — R1011 Right upper quadrant pain: Secondary | ICD-10-CM

## 2016-06-15 DIAGNOSIS — Z79899 Other long term (current) drug therapy: Secondary | ICD-10-CM | POA: Diagnosis not present

## 2016-06-15 DIAGNOSIS — R194 Change in bowel habit: Secondary | ICD-10-CM | POA: Diagnosis not present

## 2016-06-15 DIAGNOSIS — Z1329 Encounter for screening for other suspected endocrine disorder: Secondary | ICD-10-CM | POA: Diagnosis not present

## 2016-06-15 DIAGNOSIS — R5381 Other malaise: Secondary | ICD-10-CM | POA: Diagnosis not present

## 2016-06-15 DIAGNOSIS — R109 Unspecified abdominal pain: Secondary | ICD-10-CM | POA: Diagnosis not present

## 2016-06-25 ENCOUNTER — Ambulatory Visit
Admission: RE | Admit: 2016-06-25 | Discharge: 2016-06-25 | Disposition: A | Discharge status: Home / Self Care | Payer: 59 | Source: Ambulatory Visit | Attending: Internal Medicine | Admitting: Internal Medicine

## 2016-06-25 DIAGNOSIS — Z1231 Encounter for screening mammogram for malignant neoplasm of breast: Secondary | ICD-10-CM | POA: Insufficient documentation

## 2016-06-26 ENCOUNTER — Ambulatory Visit: Payer: 59

## 2016-06-29 ENCOUNTER — Other Ambulatory Visit: Payer: Self-pay | Admitting: Internal Medicine

## 2016-06-29 DIAGNOSIS — N6489 Other specified disorders of breast: Secondary | ICD-10-CM

## 2016-06-30 ENCOUNTER — Ambulatory Visit
Admission: RE | Admit: 2016-06-30 | Discharge: 2016-06-30 | Disposition: A | Discharge status: Home / Self Care | Payer: 59 | Source: Ambulatory Visit | Attending: Internal Medicine | Admitting: Internal Medicine

## 2016-06-30 DIAGNOSIS — Z9049 Acquired absence of other specified parts of digestive tract: Secondary | ICD-10-CM | POA: Diagnosis not present

## 2016-06-30 DIAGNOSIS — R1011 Right upper quadrant pain: Secondary | ICD-10-CM | POA: Insufficient documentation

## 2016-06-30 DIAGNOSIS — D1803 Hemangioma of intra-abdominal structures: Secondary | ICD-10-CM | POA: Diagnosis not present

## 2016-07-09 ENCOUNTER — Ambulatory Visit
Admission: RE | Admit: 2016-07-09 | Discharge: 2016-07-09 | Disposition: A | Discharge status: Home / Self Care | Payer: 59 | Source: Ambulatory Visit | Attending: Internal Medicine | Admitting: Internal Medicine

## 2016-07-09 DIAGNOSIS — N6489 Other specified disorders of breast: Secondary | ICD-10-CM | POA: Diagnosis not present

## 2016-07-09 DIAGNOSIS — R928 Other abnormal and inconclusive findings on diagnostic imaging of breast: Secondary | ICD-10-CM | POA: Diagnosis not present

## 2016-07-28 DIAGNOSIS — K588 Other irritable bowel syndrome: Secondary | ICD-10-CM | POA: Diagnosis not present

## 2016-07-28 DIAGNOSIS — R1013 Epigastric pain: Secondary | ICD-10-CM | POA: Diagnosis not present

## 2016-07-28 DIAGNOSIS — D1803 Hemangioma of intra-abdominal structures: Secondary | ICD-10-CM | POA: Diagnosis not present

## 2016-08-13 DIAGNOSIS — Z1329 Encounter for screening for other suspected endocrine disorder: Secondary | ICD-10-CM | POA: Diagnosis not present

## 2016-08-15 ENCOUNTER — Encounter: Payer: Self-pay | Admitting: Emergency Medicine

## 2016-08-15 ENCOUNTER — Emergency Department
Admission: EM | Admit: 2016-08-15 | Discharge: 2016-08-16 | Disposition: A | Discharge status: Home / Self Care | Payer: 59 | Attending: Student in an Organized Health Care Education/Training Program | Admitting: Student in an Organized Health Care Education/Training Program

## 2016-08-15 DIAGNOSIS — Z87891 Personal history of nicotine dependence: Secondary | ICD-10-CM | POA: Insufficient documentation

## 2016-08-15 DIAGNOSIS — Z79899 Other long term (current) drug therapy: Secondary | ICD-10-CM | POA: Insufficient documentation

## 2016-08-15 DIAGNOSIS — J019 Acute sinusitis, unspecified: Secondary | ICD-10-CM | POA: Insufficient documentation

## 2016-08-15 DIAGNOSIS — R509 Fever, unspecified: Secondary | ICD-10-CM | POA: Diagnosis not present

## 2016-08-15 HISTORY — DX: Anemia, unspecified: D64.9

## 2016-08-15 MED ORDER — PREDNISONE 10 MG PO TABS: ORAL_TABLET | ORAL | 0 refills | Status: DC

## 2016-08-15 MED ORDER — AZITHROMYCIN 250 MG PO TABS: ORAL_TABLET | ORAL | 0 refills | Status: DC

## 2016-08-15 NOTE — ED Provider Notes (Signed)
[facility] Emergency Department Provider Note   ____________________________________________   First MD Initiated Contact with Patient 08/15/16 2350     (approximate)  I have reviewed the triage vital signs and the nursing notes.   HISTORY  Chief Complaint Recurrent Sinusitis   HPI Jennifer Mclaughlin is a 45 y.o. female is here complaining of sinus drainage and sinus infection since Tuesday. Patient states she is unaware of any fever but is aware that she is having posterior drainage in the back for throat which is causing additional throat irritation. Patient states she has a history of sinus infections in the past and has been on Zithromax multiple times but not recently. Patient is taking a allergy medication along with Flonase nasal spray.She rates her pain as 5 out of 10.   Past Medical History:  Diagnosis Date  . Anemia     There are no active problems to display for this patient.   Past Surgical History:  Procedure Laterality Date  . CHOLECYSTECTOMY    . NASAL SINUS SURGERY    . TONSILLECTOMY    . TUBAL LIGATION      Prior to Admission medications   Medication Sig Start Date End Date Taking? Authorizing Provider  azithromycin (ZITHROMAX Z-PAK) 250 MG tablet Take 2 tablets (500 mg) on  Day 1,  followed by 1 tablet (250 mg) once daily on Days 2 through 5. 08/15/16   Johnn Hai, PA-C  cetirizine (ZYRTEC) 10 MG tablet Take 10 mg by mouth daily.    Historical Provider, MD  clindamycin (CLEOCIN) 150 MG capsule Take 1 capsule (150 mg total) by mouth 4 (four) times daily. Patient not taking: Reported on 05/29/2016 10/11/15   Sable Feil, PA-C  cyanocobalamin (,VITAMIN B-12,) 1000 MCG/ML injection Inject into the muscle. 10/16/15   Historical Provider, MD  cyclobenzaprine (FLEXERIL) 10 MG tablet Take 1 tablet (10 mg total) by mouth 3 (three) times daily as needed for muscle spasms. 05/29/16   Versie Starks, PA-C  ibuprofen (ADVIL,MOTRIN) 600 MG  tablet Take 1 tablet (600 mg total) by mouth every 8 (eight) hours as needed. 10/11/15   Sable Feil, PA-C  Melatonin (MELATONIN MAXIMUM STRENGTH) 5 MG TABS Take by mouth.    Historical Provider, MD  methylPREDNISolone (MEDROL DOSEPAK) 4 MG TBPK tablet Take 6 pills on day one then decrease by 1 pill each day 05/29/16   Versie Starks, PA-C  omeprazole (PRILOSEC) 40 MG capsule Take by mouth. 05/21/15 05/20/16  Historical Provider, MD  predniSONE (DELTASONE) 10 MG tablet Take 3 tablets once daily for 3 days. 08/15/16   Johnn Hai, PA-C  ranitidine (ZANTAC) 150 MG tablet Take 1 tablet (150 mg total) by mouth 2 (two) times daily. Patient not taking: Reported on 05/29/2016 04/19/15   Dannielle Karvonen Menshew, PA-C  Syringe/Needle, Disp, (SYRINGE 3CC/25GX1") 25G X 1" 3 ML MISC  12/11/15   Historical Provider, MD  traMADol (ULTRAM) 50 MG tablet Take 1 tablet (50 mg total) by mouth every 6 (six) hours as needed for moderate pain. Patient not taking: Reported on 05/29/2016 10/11/15   Sable Feil, PA-C    Allergies Biaxin [clarithromycin]; Ceftin [cefuroxime axetil]; Penicillins; Septra [sulfamethoxazole-trimethoprim]; Sulfa antibiotics; Amoxicillin; Dicyclomine; Hydrochlorothiazide; Linaclotide; Nebivolol; and Telmisartan  Family History  Problem Relation Age of Onset  . Thyroid cancer Father   . Breast cancer Neg Hx     Social History Social History  Substance Use Topics  . Smoking status: Former  Smoker  . Smokeless tobacco: Never Used  . Alcohol use 0.0 oz/week     Comment: occas    Review of Systems Constitutional: No fever/chills Eyes: No visual changes. ENT: Has a sore throat. Positive sinus pain. Cardiovascular: Denies chest pain. Respiratory: Denies shortness of breath. Gastrointestinal:   No nausea Skin: Negative for rash. Neurological: Negative for headaches  10-point ROS otherwise negative.  ____________________________________________   PHYSICAL EXAM:  VITAL  SIGNS: ED Triage Vitals  Enc Vitals Group     BP 08/15/16 2314 (!) 136/92     Pulse Rate 08/15/16 2314 83     Resp 08/15/16 2314 18     Temp 08/15/16 2314 98 F (36.7 C)     Temp src --      SpO2 08/15/16 2314 96 %     Weight 08/15/16 2308 185 lb (83.9 kg)     Height 08/15/16 2308 5\' 2"  (1.575 m)     Head Circumference --      Peak Flow --      Pain Score 08/15/16 2310 5     Pain Loc --      Pain Edu? --      Excl. in [place]? --     Constitutional: Alert and oriented. Well appearing and in no acute distress. Eyes: Conjunctivae are normal. PERRL. EOMI. Head: Atraumatic.  Nontender sinuses to percussion. Nose: Mild congestion/rhinnorhea.  EACs are clear bilaterally. TMs are dull with minimal effusion bilaterally. Mouth/Throat: Mucous membranes are moist.  Oropharynx non-erythematous. Posterior drainage present. Neck: No stridor.   Hematological/Lymphatic/Immunilogical: No cervical lymphadenopathy. Cardiovascular: Normal rate, regular rhythm. Grossly normal heart sounds.  Good peripheral circulation. Respiratory: Normal respiratory effort.  No retractions. Lungs CTAB. Musculoskeletal: No lower extremity tenderness nor edema.  No joint effusions. Neurologic:  Normal speech and language. No gross focal neurologic deficits are appreciated.  Skin:  Skin is warm, dry and intact. No rash noted. Psychiatric: Mood and affect are normal. Speech and behavior are normal.  ____________________________________________   LABS (all labs ordered are listed, but only abnormal results are displayed)  Labs Reviewed - No data to display  PROCEDURES  Procedure(s) performed: None  Procedures  Critical Care performed: No  ____________________________________________   INITIAL IMPRESSION / ASSESSMENT AND PLAN / ED COURSE  Pertinent labs & imaging results that were available during my care of the patient were reviewed by me and considered in my medical decision making (see chart for  details).    Clinical Course    Patient is given prescription for prednisone 30 mg for 3 days along with a Z-Pak. Patient is follow-up with her primary care doctor if any continued problems. She is also to take her continued allergy medicine and Flonase.  ____________________________________________   FINAL CLINICAL IMPRESSION(S) / ED DIAGNOSES  Final diagnoses:  Acute non-recurrent sinusitis, unspecified location      NEW MEDICATIONS STARTED DURING THIS VISIT:  New Prescriptions   AZITHROMYCIN (ZITHROMAX Z-PAK) 250 MG TABLET    Take 2 tablets (500 mg) on  Day 1,  followed by 1 tablet (250 mg) once daily on Days 2 through 5.   PREDNISONE (DELTASONE) 10 MG TABLET    Take 3 tablets once daily for 3 days.     Note:  This document was prepared using Dragon voice recognition software and may include unintentional dictation errors.    Johnn Hai, PA-C 08/16/16 [place], PA-C 08/16/16 0016    Merlyn Lot, MD 08/16/16 [phone]

## 2016-08-15 NOTE — ED Triage Notes (Signed)
Patient reports sinus infection since  Tuesday.

## 2016-08-15 NOTE — Discharge Instructions (Signed)
Continue your allergy medication as directed. Continue Flonase nasal spray. Begin taking prednisone 10 mg 3 tablets once daily for 3 days. Begin Zithromax and take as directed. Follow-up with your primary care doctor if any continued problems.

## 2016-08-19 DIAGNOSIS — D1803 Hemangioma of intra-abdominal structures: Secondary | ICD-10-CM | POA: Diagnosis not present

## 2016-10-13 ENCOUNTER — Encounter: Payer: Self-pay | Admitting: Emergency Medicine

## 2016-10-13 ENCOUNTER — Emergency Department
Admission: EM | Admit: 2016-10-13 | Discharge: 2016-10-13 | Disposition: A | Discharge status: Home / Self Care | Payer: 59 | Attending: Emergency Medicine | Admitting: Emergency Medicine

## 2016-10-13 DIAGNOSIS — M545 Low back pain: Secondary | ICD-10-CM | POA: Diagnosis present

## 2016-10-13 DIAGNOSIS — M5432 Sciatica, left side: Secondary | ICD-10-CM

## 2016-10-13 DIAGNOSIS — M5442 Lumbago with sciatica, left side: Secondary | ICD-10-CM | POA: Insufficient documentation

## 2016-10-13 DIAGNOSIS — Z87891 Personal history of nicotine dependence: Secondary | ICD-10-CM | POA: Insufficient documentation

## 2016-10-13 MED ORDER — BACLOFEN 10 MG PO TABS: 10.0000 mg | ORAL_TABLET | Freq: Three times a day (TID) | ORAL | 0 refills | Status: AC | PRN

## 2016-10-13 MED ORDER — KETOROLAC TROMETHAMINE 30 MG/ML IJ SOLN
30.0000 mg | Freq: Once | INTRAMUSCULAR | Status: AC
  Administered 2016-10-13: 30 mg via INTRAMUSCULAR
  Filled 2016-10-13: qty 1

## 2016-10-13 NOTE — ED Notes (Signed)
See triage note  States she developed lower back pain which radiates into left hip and down left leg  Sx's started about 1 week ago denies any specific injury  Ambulates well w/o limp

## 2016-10-13 NOTE — ED Provider Notes (Signed)
[facility] Emergency Department Provider Note  ____________________________________________  Time seen: Approximately 7:13 AM  I have reviewed the triage vital signs and the nursing notes.   HISTORY  Chief Complaint Sciatica    HPI Jennifer Mclaughlin is a 46 y.o. female , NAD, presents to the emergency department with 1 week history of left-sided lower back pain. Patient states she has had left lower back pain with radiation into the left lower leg for approximately 1 week. Has worsened over the last 1-2 days. Denies certain injury, trauma or fall to cause the pain. Has had similar back pain 2 times over the last 4 years but those were related to a lifting injury. Does note that approximately 8 days ago she was lifting and moving tables but denies onset of pain during that time. Has taken Flexeril and ibuprofen without any alleviation of pain. States the pain was a 9 out of 10 today. Has not noted any rashes or redness of the area. Denies any changes in urinary or bowel patterns. Has had no numbness, weakness or tingling. Denies saddle paresthesias nor loss of bowel or bladder control. No fevers, chills, or fatigue. Denies chest pain or shortness of breath. No abdominal pain.    Past Medical History:  Diagnosis Date  . Anemia     There are no active problems to display for this patient.   Past Surgical History:  Procedure Laterality Date  . CHOLECYSTECTOMY    . NASAL SINUS SURGERY    . TONSILLECTOMY    . TUBAL LIGATION      Prior to Admission medications   Medication Sig Start Date End Date Taking? Authorizing Provider  baclofen (LIORESAL) 10 MG tablet Take 1 tablet (10 mg total) by mouth 3 (three) times daily as needed for muscle spasms. 10/13/16 10/20/16  Jami L Hagler, PA-C  cetirizine (ZYRTEC) 10 MG tablet Take 10 mg by mouth daily.    Historical Provider, MD  cyanocobalamin (,VITAMIN B-12,) 1000 MCG/ML injection Inject into the muscle. 10/16/15   Historical  Provider, MD  cyclobenzaprine (FLEXERIL) 10 MG tablet Take 1 tablet (10 mg total) by mouth 3 (three) times daily as needed for muscle spasms. 05/29/16   Versie Starks, PA-C  ibuprofen (ADVIL,MOTRIN) 600 MG tablet Take 1 tablet (600 mg total) by mouth every 8 (eight) hours as needed. 10/11/15   Sable Feil, PA-C  Melatonin (MELATONIN MAXIMUM STRENGTH) 5 MG TABS Take by mouth.    Historical Provider, MD  omeprazole (PRILOSEC) 40 MG capsule Take by mouth. 05/21/15 05/20/16  Historical Provider, MD  Syringe/Needle, Disp, (SYRINGE 3CC/25GX1") 25G X 1" 3 ML MISC  12/11/15   Historical Provider, MD    Allergies Biaxin [clarithromycin]; Ceftin [cefuroxime axetil]; Penicillins; Septra [sulfamethoxazole-trimethoprim]; Sulfa antibiotics; Amoxicillin; Dicyclomine; Hydrochlorothiazide; Linaclotide; Nebivolol; and Telmisartan  Family History  Problem Relation Age of Onset  . Thyroid cancer Father   . Breast cancer Neg Hx     Social History Social History  Substance Use Topics  . Smoking status: Former Research scientist (life sciences)  . Smokeless tobacco: Never Used  . Alcohol use 0.0 oz/week     Comment: occas     Review of Systems  Constitutional: No fever/chills Cardiovascular: No chest pain. Respiratory:  No shortness of breath. Gastrointestinal: No abdominal pain.  No nausea, vomiting.  No diarrhea.  No constipation. Genitourinary: Negative for dysuria, hematuria. No urinary hesitancy, urgency or increased frequency. Musculoskeletal: Positive for left lower back pain radiating into left leg.  Skin: Negative for  rash, Redness, skin sores. Neurological: Negative for Numbness, weakness, tingling. No saddle paresthesias nor loss of bowel or bladder control 10-point ROS otherwise negative.  ____________________________________________   PHYSICAL EXAM:  VITAL SIGNS: ED Triage Vitals [10/13/16 0708]  Enc Vitals Group     BP (!) 148/96     Pulse Rate 69     Resp 18     Temp 97.9 F (36.6 C)     Temp Source  Oral     SpO2 98 %     Weight 188 lb (85.3 kg)     Height 5\' 3"  (1.6 m)     Head Circumference      Peak Flow      Pain Score 9     Pain Loc      Pain Edu?      Excl. in [place]?      Constitutional: Alert and oriented. Well appearing and in no acute distress. Eyes: Conjunctivae are normal. Head: Atraumatic. Neck: Supple with full range of motion. No cervical spine tenderness to palpation. Hematological/Lymphatic/Immunilogical: No cervical lymphadenopathy. Cardiovascular: Good peripheral circulation with 2+ pulses noted in bilateral lower extremities. Respiratory: Normal respiratory effort without tachypnea or retractions.  Musculoskeletal: No tenderness to palpation of the midline thoracic or lumbar spinal regions. Positive left SI joint tenderness to palpation. Negative right SI joint tenderness to palpation. Full range of motion of bilateral lower extremities without pain or difficulty. Positive left straight leg raise. Negative right straight leg raise. No lower extremity tenderness nor edema.  No joint effusions. Neurologic:  Normal speech and language. No gross focal neurologic deficits are appreciated.  Skin:  Skin is warm, dry and intact. No rash, redness, swelling, skin sores noted. Psychiatric: Mood and affect are normal. Speech and behavior are normal. Patient exhibits appropriate insight and judgement.   ____________________________________________   LABS  None ____________________________________________  EKG  None ____________________________________________  RADIOLOGY  None ____________________________________________    PROCEDURES  Procedure(s) performed: None   Procedures   Medications  ketorolac (TORADOL) 30 MG/ML injection 30 mg (30 mg Intramuscular Given 10/13/16 0751)     ____________________________________________   INITIAL IMPRESSION / ASSESSMENT AND PLAN / ED COURSE  Pertinent labs & imaging results that were available during my care  of the patient were reviewed by me and considered in my medical decision making (see chart for details).     Patient's diagnosis is consistent with Sciatica of the left side. Patient will be discharged home with prescriptions for baclofen to take as directed. Patient is to follow up with her primary care provider if symptoms persist past this treatment course. Patient is given ED precautions to return to the ED for any worsening or new symptoms.   ____________________________________________  FINAL CLINICAL IMPRESSION(S) / ED DIAGNOSES  Final diagnoses:  Sciatica of left side      NEW MEDICATIONS STARTED DURING THIS VISIT:  New Prescriptions   BACLOFEN (LIORESAL) 10 MG TABLET    Take 1 tablet (10 mg total) by mouth 3 (three) times daily as needed for muscle spasms.         Braxton Feathers, PA-C 10/13/16 [place], MD 10/13/16 [phone]

## 2016-10-13 NOTE — ED Triage Notes (Signed)
Pt with left hip/back pain.

## 2016-10-21 DIAGNOSIS — E538 Deficiency of other specified B group vitamins: Secondary | ICD-10-CM | POA: Diagnosis not present

## 2016-10-21 DIAGNOSIS — E039 Hypothyroidism, unspecified: Secondary | ICD-10-CM | POA: Diagnosis not present

## 2016-10-21 DIAGNOSIS — E7800 Pure hypercholesterolemia, unspecified: Secondary | ICD-10-CM | POA: Diagnosis not present

## 2016-10-21 DIAGNOSIS — Z79899 Other long term (current) drug therapy: Secondary | ICD-10-CM | POA: Diagnosis not present

## 2016-10-21 DIAGNOSIS — I10 Essential (primary) hypertension: Secondary | ICD-10-CM | POA: Diagnosis not present

## 2016-12-11 ENCOUNTER — Other Ambulatory Visit: Payer: Self-pay | Admitting: Obstetrics and Gynecology

## 2016-12-11 DIAGNOSIS — E669 Obesity, unspecified: Secondary | ICD-10-CM | POA: Diagnosis not present

## 2016-12-11 DIAGNOSIS — Z124 Encounter for screening for malignant neoplasm of cervix: Secondary | ICD-10-CM | POA: Diagnosis not present

## 2016-12-11 DIAGNOSIS — Z1231 Encounter for screening mammogram for malignant neoplasm of breast: Secondary | ICD-10-CM | POA: Diagnosis not present

## 2016-12-11 DIAGNOSIS — R300 Dysuria: Secondary | ICD-10-CM | POA: Diagnosis not present

## 2016-12-11 DIAGNOSIS — Z01419 Encounter for gynecological examination (general) (routine) without abnormal findings: Secondary | ICD-10-CM | POA: Diagnosis not present

## 2016-12-11 DIAGNOSIS — Z1211 Encounter for screening for malignant neoplasm of colon: Secondary | ICD-10-CM | POA: Diagnosis not present

## 2016-12-11 DIAGNOSIS — R3129 Other microscopic hematuria: Secondary | ICD-10-CM | POA: Diagnosis not present

## 2017-02-08 ENCOUNTER — Ambulatory Visit: Payer: Self-pay | Admitting: Physician Assistant

## 2017-02-08 VITALS — BP 126/82 | HR 58 | Temp 98.3°F

## 2017-02-08 DIAGNOSIS — J209 Acute bronchitis, unspecified: Secondary | ICD-10-CM

## 2017-02-08 MED ORDER — METHYLPREDNISOLONE 4 MG PO TBPK: ORAL_TABLET | ORAL | 0 refills | Status: DC

## 2017-02-08 MED ORDER — DOXYCYCLINE HYCLATE 100 MG PO TABS: 100.0000 mg | ORAL_TABLET | Freq: Two times a day (BID) | ORAL | 0 refills | Status: DC

## 2017-02-08 MED ORDER — ALBUTEROL SULFATE HFA 108 (90 BASE) MCG/ACT IN AERS: 2.0000 | INHALATION_SPRAY | Freq: Four times a day (QID) | RESPIRATORY_TRACT | 0 refills | Status: DC | PRN

## 2017-02-08 NOTE — Progress Notes (Signed)
S: C/o cough and congestion with ?wheezing, chest is sore from coughing, denies fever, chills; states mucus is green but mainly cough is dry and hacking; keeping pt awake at night;  denies cardiac type chest pain or sob, v/d, abd pain, used a zpack 3 weeks ago got a little better but sx have worsened over the last 2 weeks Remainder ros neg  O: vitals wnl, nad, tms clear, throat injected, neck supple no lymph, lungs with wheezing, cv rrr, neuro intact  A:  Acute bronchitis   P:  rx medication, doxy, medrol dose pack, albuterol inhaler;  use otc meds, tylenol or motrin as needed for fever/chills, return if not better in 3 -5 days, return earlier if worsening

## 2017-02-17 IMAGING — MG MM DIGITAL DIAGNOSTIC UNILAT*L* W/ TOMO W/ CAD
6 series · 6 of 14 positions shown · non-contrast
Comparison: Previous exam(s).

CLINICAL DATA: The patient was called back for a left breast
asymmetry

EXAM:
2D DIGITAL DIAGNOSTIC UNILATERAL LEFT MAMMOGRAM WITH CAD AND ADJUNCT
TOMO

[L ML synth-2D]
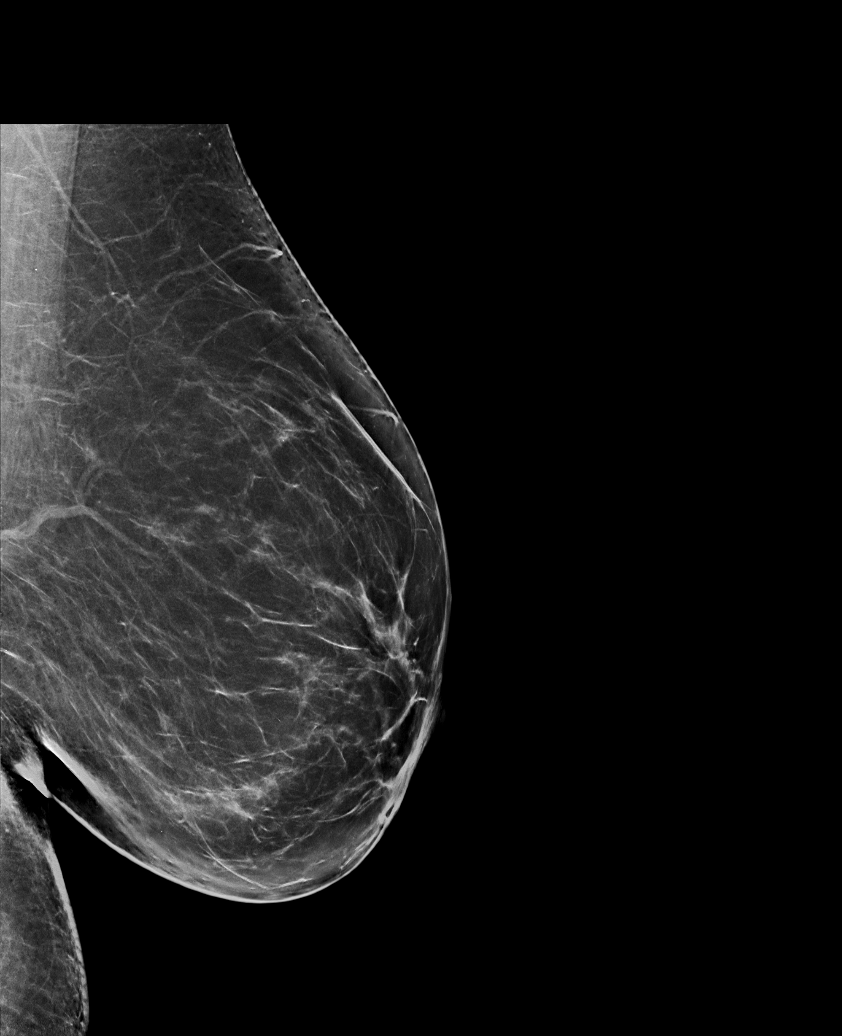

[L MLO synth-2D]
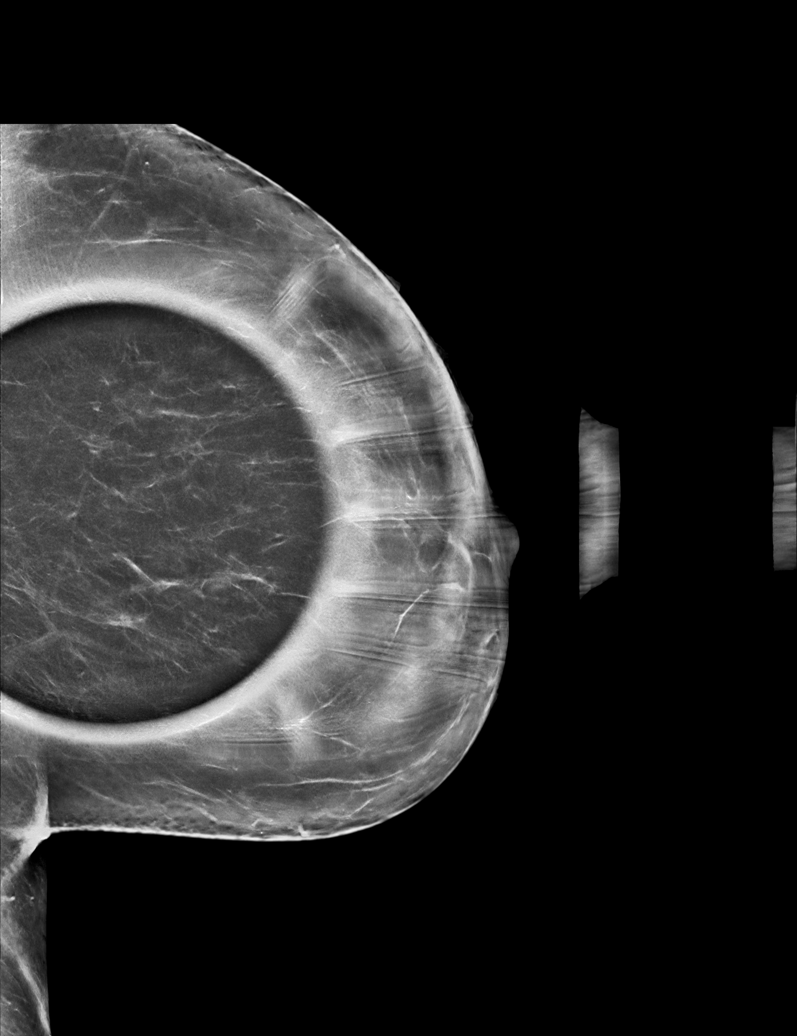

[L MLO]
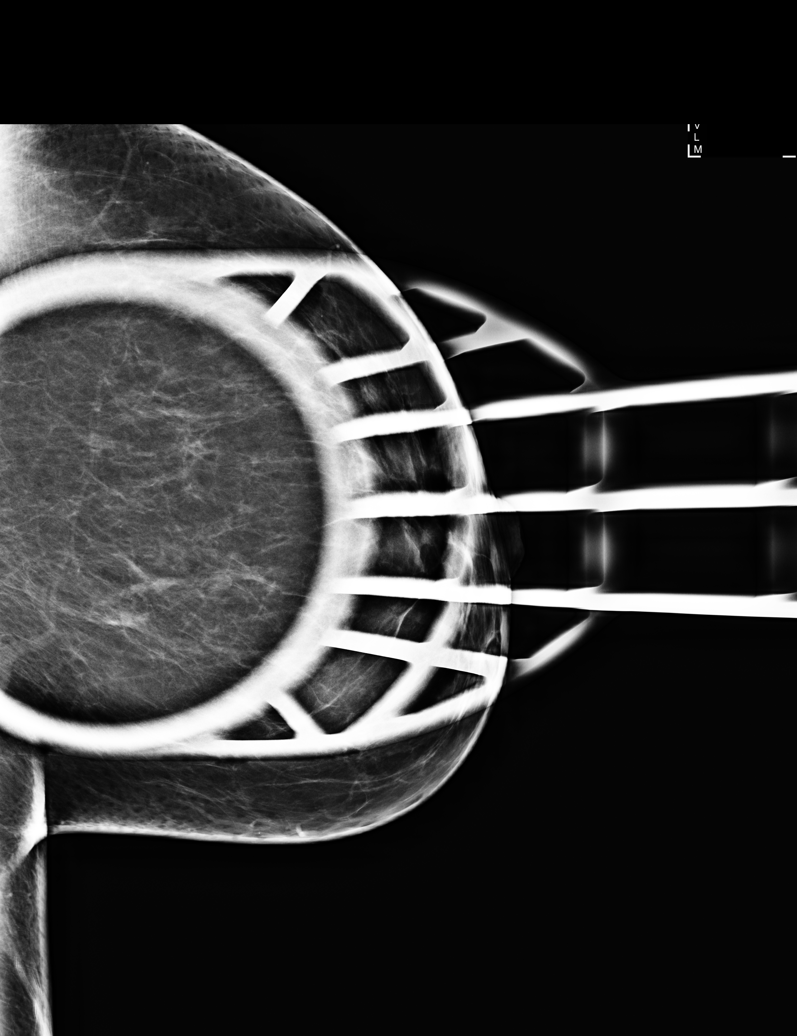

[L ML]
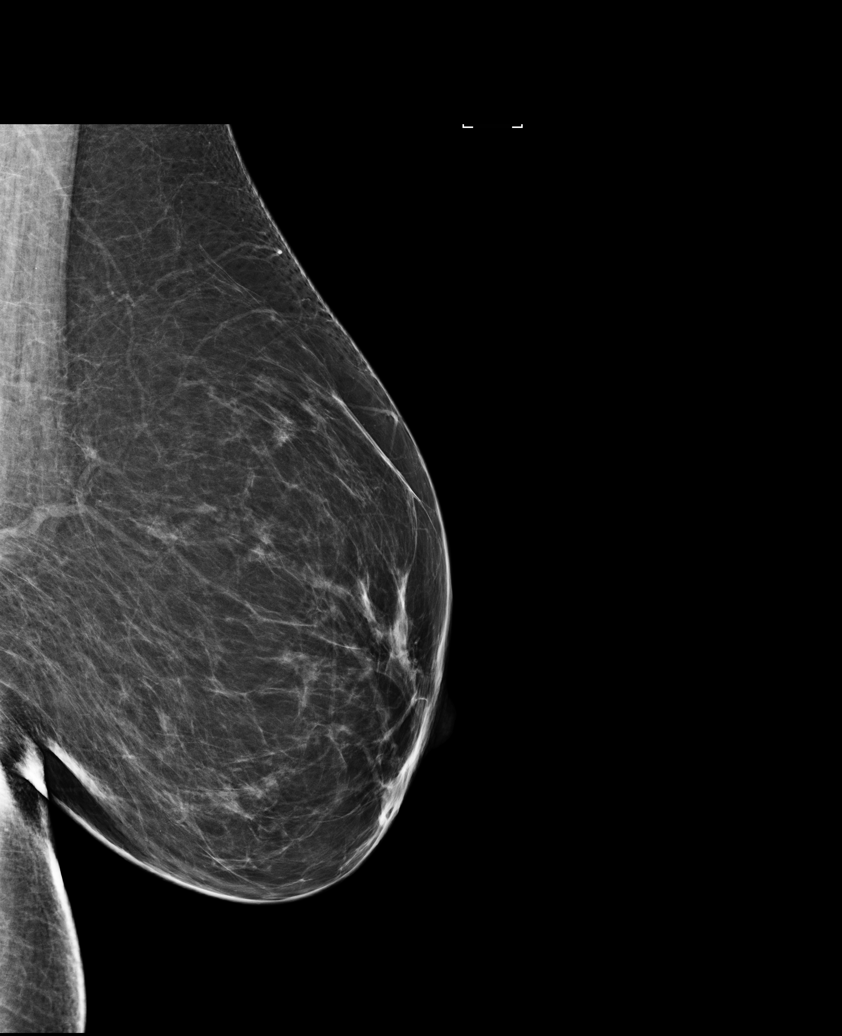

[L ML tomo · tomo slice 41/81.0]
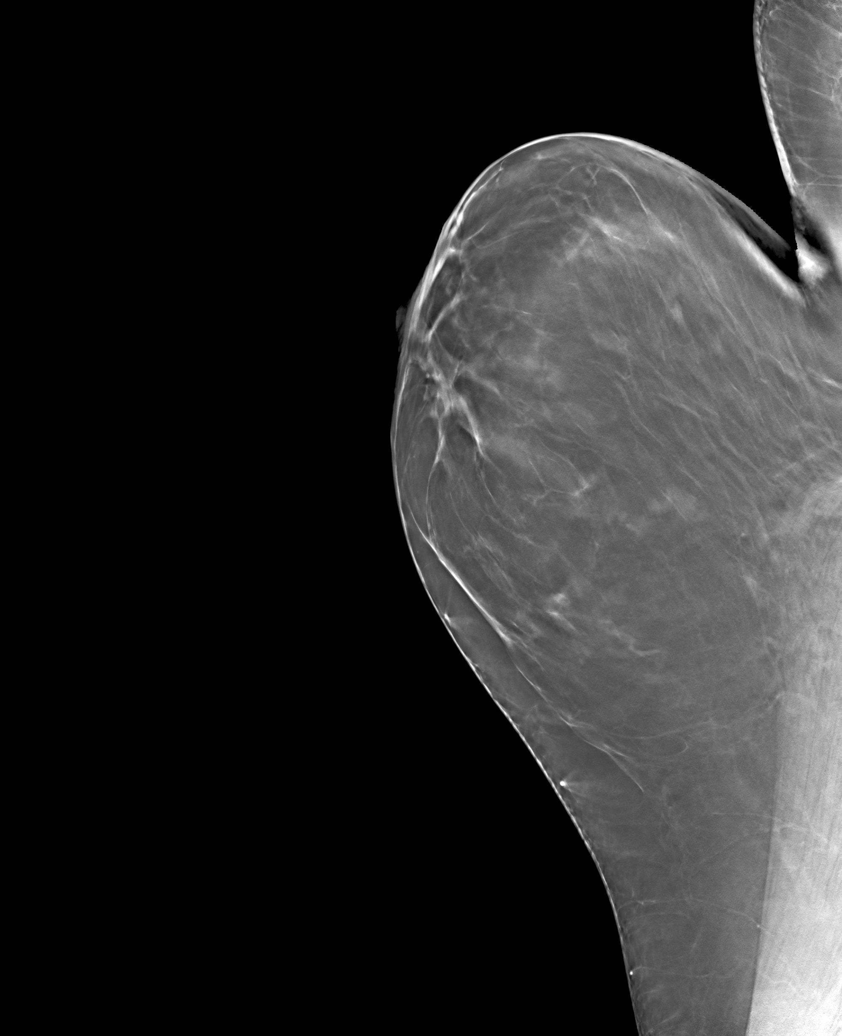

[L MLO tomo · tomo slice 40/79.0]
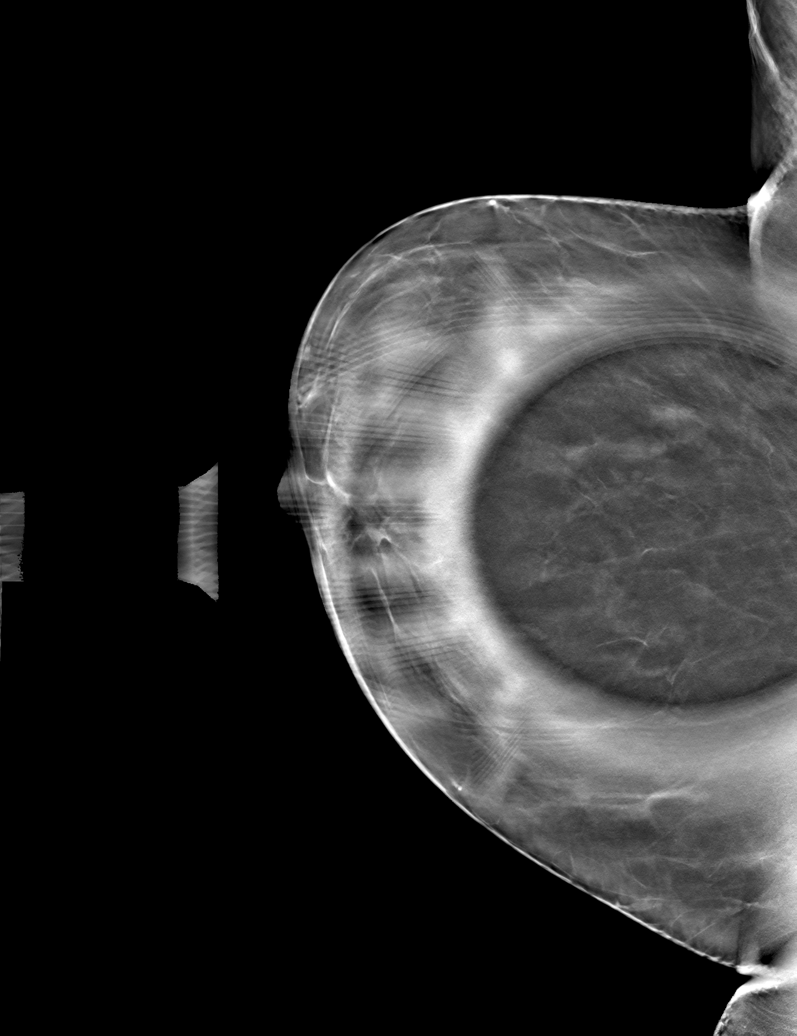

[6 of 14 positions shown; findings below may reference images not displayed]

ACR Breast Density Category b: There are scattered areas of
fibroglandular density.
FINDINGS: The left breast asymmetry resolves on additional imaging.

Mammographic images were processed with CAD.
IMPRESSION: No mammographic evidence of malignancy

RECOMMENDATION:
Annual screening mammography

I have discussed the findings and recommendations with the patient.
Results were also provided in writing at the conclusion of the
visit. If applicable, a reminder letter will be sent to the patient
regarding the next appointment.

BI-RADS CATEGORY  2: Benign.

## 2017-03-08 ENCOUNTER — Ambulatory Visit: Payer: Self-pay | Admitting: Family

## 2017-03-08 VITALS — BP 120/84 | HR 74 | Temp 98.4°F

## 2017-03-08 DIAGNOSIS — J0100 Acute maxillary sinusitis, unspecified: Secondary | ICD-10-CM

## 2017-03-08 MED ORDER — AZITHROMYCIN 250 MG PO TABS: ORAL_TABLET | ORAL | 0 refills | Status: DC

## 2017-03-08 NOTE — Progress Notes (Signed)
S/: Jennifer Mclaughlin has chronic allergies she now states she is having early symptoms of a sinus infection. She has a severe sore throat, hurts to swallow, blowing yellow and PND yellow O/: Vital signs stable ENT TMs dull retracted ,nasal mucosa -erythematous swollen; left maxillary sinus tender to palpation; pharynx :hyperemic posteriorly ;neck supple, without adenopathy ;heart regular sinus rhythm no murmurs or gallops ,lungs are clear  A/: Sinusitis  P/: Z-Pak. Supportive measures discussed. Follow up prn not improving

## 2017-06-28 ENCOUNTER — Ambulatory Visit: Payer: 59

## 2017-06-28 ENCOUNTER — Ambulatory Visit
Admission: RE | Admit: 2017-06-28 | Discharge: 2017-06-28 | Disposition: A | Discharge status: Home / Self Care | Payer: 59 | Source: Ambulatory Visit | Attending: Obstetrics and Gynecology | Admitting: Obstetrics and Gynecology

## 2017-06-28 DIAGNOSIS — R928 Other abnormal and inconclusive findings on diagnostic imaging of breast: Secondary | ICD-10-CM | POA: Diagnosis not present

## 2017-06-28 DIAGNOSIS — Z1231 Encounter for screening mammogram for malignant neoplasm of breast: Secondary | ICD-10-CM | POA: Diagnosis not present

## 2017-07-01 ENCOUNTER — Other Ambulatory Visit: Payer: Self-pay | Admitting: Obstetrics and Gynecology

## 2017-07-01 DIAGNOSIS — R928 Other abnormal and inconclusive findings on diagnostic imaging of breast: Secondary | ICD-10-CM

## 2017-07-01 DIAGNOSIS — N6489 Other specified disorders of breast: Secondary | ICD-10-CM

## 2017-07-07 ENCOUNTER — Ambulatory Visit
Admission: RE | Admit: 2017-07-07 | Discharge: 2017-07-07 | Disposition: A | Discharge status: Home / Self Care | Payer: 59 | Source: Ambulatory Visit | Attending: Obstetrics and Gynecology | Admitting: Obstetrics and Gynecology

## 2017-07-07 DIAGNOSIS — N6489 Other specified disorders of breast: Secondary | ICD-10-CM | POA: Insufficient documentation

## 2017-07-07 DIAGNOSIS — R928 Other abnormal and inconclusive findings on diagnostic imaging of breast: Secondary | ICD-10-CM | POA: Insufficient documentation

## 2017-07-21 DIAGNOSIS — I10 Essential (primary) hypertension: Secondary | ICD-10-CM | POA: Diagnosis not present

## 2017-07-21 DIAGNOSIS — D519 Vitamin B12 deficiency anemia, unspecified: Secondary | ICD-10-CM | POA: Diagnosis not present

## 2017-07-21 DIAGNOSIS — E039 Hypothyroidism, unspecified: Secondary | ICD-10-CM | POA: Diagnosis not present

## 2017-07-21 DIAGNOSIS — E538 Deficiency of other specified B group vitamins: Secondary | ICD-10-CM | POA: Diagnosis not present

## 2017-12-14 DIAGNOSIS — G4700 Insomnia, unspecified: Secondary | ICD-10-CM | POA: Diagnosis not present

## 2017-12-14 DIAGNOSIS — Z01419 Encounter for gynecological examination (general) (routine) without abnormal findings: Secondary | ICD-10-CM | POA: Diagnosis not present

## 2018-01-11 DIAGNOSIS — E039 Hypothyroidism, unspecified: Secondary | ICD-10-CM | POA: Diagnosis not present

## 2018-01-11 DIAGNOSIS — I10 Essential (primary) hypertension: Secondary | ICD-10-CM | POA: Diagnosis not present

## 2018-01-11 DIAGNOSIS — E538 Deficiency of other specified B group vitamins: Secondary | ICD-10-CM | POA: Diagnosis not present

## 2018-01-11 DIAGNOSIS — Z79899 Other long term (current) drug therapy: Secondary | ICD-10-CM | POA: Diagnosis not present

## 2018-01-11 DIAGNOSIS — E7800 Pure hypercholesterolemia, unspecified: Secondary | ICD-10-CM | POA: Diagnosis not present

## 2018-01-18 DIAGNOSIS — E538 Deficiency of other specified B group vitamins: Secondary | ICD-10-CM | POA: Diagnosis not present

## 2018-01-18 DIAGNOSIS — E039 Hypothyroidism, unspecified: Secondary | ICD-10-CM | POA: Diagnosis not present

## 2018-01-18 DIAGNOSIS — Z0000 Encounter for general adult medical examination without abnormal findings: Secondary | ICD-10-CM | POA: Diagnosis not present

## 2018-01-18 DIAGNOSIS — I10 Essential (primary) hypertension: Secondary | ICD-10-CM | POA: Diagnosis not present

## 2018-03-21 DIAGNOSIS — M9903 Segmental and somatic dysfunction of lumbar region: Secondary | ICD-10-CM | POA: Diagnosis not present

## 2018-03-21 DIAGNOSIS — M62830 Muscle spasm of back: Secondary | ICD-10-CM | POA: Diagnosis not present

## 2018-03-21 DIAGNOSIS — M9904 Segmental and somatic dysfunction of sacral region: Secondary | ICD-10-CM | POA: Diagnosis not present

## 2018-05-04 ENCOUNTER — Ambulatory Visit: Payer: Self-pay | Admitting: Family

## 2018-05-04 ENCOUNTER — Encounter: Payer: Self-pay | Admitting: Family

## 2018-05-04 VITALS — BP 125/80 | HR 87 | Temp 98.7°F | Resp 18 | Wt 175.6 lb

## 2018-05-04 DIAGNOSIS — L03818 Cellulitis of other sites: Secondary | ICD-10-CM

## 2018-05-04 MED ORDER — DOXYCYCLINE HYCLATE 100 MG PO TABS: 100.0000 mg | ORAL_TABLET | Freq: Two times a day (BID) | ORAL | 0 refills | Status: DC

## 2018-05-04 NOTE — Progress Notes (Signed)
Subjective:     Patient ID: Jennifer Mclaughlin, female   DOB: [birth year], 47 y.o.   MRN: [id number]  HPI 47 year old female is in today with c/o red, tender area on her right hip x 2 days. Has not taken any medication. No fever or chills.   Review of Systems  Skin:       Redness to the right hip area  All other systems reviewed and are negative.    Past Medical History:  Diagnosis Date  . Anemia     Social History   Socioeconomic History  . Marital status: Married    Spouse name: Not on file  . Number of children: Not on file  . Years of education: Not on file  . Highest education level: Not on file  Occupational History  . Not on file  Social Needs  . Financial resource strain: Not on file  . Food insecurity:    Worry: Not on file    Inability: Not on file  . Transportation needs:    Medical: Not on file    Non-medical: Not on file  Tobacco Use  . Smoking status: Former Research scientist (life sciences)  . Smokeless tobacco: Never Used  Substance and Sexual Activity  . Alcohol use: Yes    Alcohol/week: 0.0 standard drinks    Comment: occas  . Drug use: No  . Sexual activity: Not on file  Lifestyle  . Physical activity:    Days per week: Not on file    Minutes per session: Not on file  . Stress: Not on file  Relationships  . Social connections:    Talks on phone: Not on file    Gets together: Not on file    Attends religious service: Not on file    Active member of club or organization: Not on file    Attends meetings of clubs or organizations: Not on file    Relationship status: Not on file  . Intimate partner violence:    Fear of current or ex partner: Not on file    Emotionally abused: Not on file    Physically abused: Not on file    Forced sexual activity: Not on file  Other Topics Concern  . Not on file  Social History Narrative  . Not on file    Past Surgical History:  Procedure Laterality Date  . CHOLECYSTECTOMY    . NASAL SINUS SURGERY    . TONSILLECTOMY    .  TUBAL LIGATION      Family History  Problem Relation Age of Onset  . Thyroid cancer Father   . Breast cancer Neg Hx     Allergies  Allergen Reactions  . Biaxin [Clarithromycin] Swelling  . Ceftin [Cefuroxime Axetil] Swelling  . Penicillins Hives  . Septra [Sulfamethoxazole-Trimethoprim] Swelling  . Sulfa Antibiotics Swelling    Other reaction(s): Unknown  . Amoxicillin     Other reaction(s): Unknown  . Dicyclomine     Other reaction(s): Other (See Comments) Flushing  . Hydrochlorothiazide     Other reaction(s): Other (See Comments) Swollen tongue  . Linaclotide Diarrhea    Flushing  . Nebivolol     Other reaction(s): Unknown  . Telmisartan     Other reaction(s): Unknown    Current Outpatient Medications on File Prior to Visit  Medication Sig Dispense Refill  . ALPRAZolam (XANAX) 0.5 MG tablet   1  . cetirizine (ZYRTEC) 10 MG tablet Take 10 mg by mouth daily.    . cyanocobalamin (,  VITAMIN B-12,) 1000 MCG/ML injection Inject into the muscle.    . fluticasone (FLONASE) 50 MCG/ACT nasal spray   3  . albuterol (PROVENTIL HFA;VENTOLIN HFA) 108 (90 Base) MCG/ACT inhaler Inhale 2 puffs into the lungs every 6 (six) hours as needed for wheezing or shortness of breath. (Patient not taking: Reported on 05/04/2018) 1 Inhaler 0  . azithromycin (ZITHROMAX Z-PAK) 250 MG tablet As directed (Patient not taking: Reported on 05/04/2018) 6 each 0  . cyclobenzaprine (FLEXERIL) 10 MG tablet Take 1 tablet (10 mg total) by mouth 3 (three) times daily as needed for muscle spasms. (Patient not taking: Reported on 03/08/2017) 30 tablet 0  . ibuprofen (ADVIL,MOTRIN) 600 MG tablet Take 1 tablet (600 mg total) by mouth every 8 (eight) hours as needed. (Patient not taking: Reported on 05/04/2018) 15 tablet 0  . Melatonin (MELATONIN MAXIMUM STRENGTH) 5 MG TABS Take by mouth.    . methylPREDNISolone (MEDROL DOSEPAK) 4 MG TBPK tablet Take 6 pills on day one then decrease by 1 pill each day (Patient not taking:  Reported on 03/08/2017) 21 tablet 0  . omeprazole (PRILOSEC) 40 MG capsule Take by mouth.    . Syringe/Needle, Disp, (SYRINGE 3CC/25GX1") 25G X 1" 3 ML MISC      No current facility-administered medications on file prior to visit.     BP 125/80 (BP Location: Left Arm, Patient Position: Sitting, Cuff Size: Normal)   Pulse 87   Temp 98.7 F (37.1 C) (Oral)   Resp 18   Wt 175 lb 9.6 oz (79.7 kg)   SpO2 97%   BMI 31.11 kg/m chart    Objective:   Physical Exam  Constitutional: She is oriented to person, place, and time. She appears well-developed and well-nourished.  Cardiovascular: Normal rate, regular rhythm and normal heart sounds.  Pulmonary/Chest: Effort normal and breath sounds normal.  Neurological: She is alert and oriented to person, place, and time.  Skin: There is erythema.  Erythema noted to the right hip. Small abscess.96mm noted. Mild tenderness  Psychiatric: She has a normal mood and affect.       Assessment:     Merrill was seen today for skin problem.  Diagnoses and all orders for this visit:  Cellulitis of other specified site  Other orders -     doxycycline (VIBRA-TABS) 100 MG tablet; Take 1 tablet (100 mg total) by mouth 2 (two) times daily.      Plan:     Call the office with any questions or concerns.

## 2018-05-04 NOTE — Patient Instructions (Signed)

## 2018-06-14 ENCOUNTER — Other Ambulatory Visit: Payer: Self-pay | Admitting: Internal Medicine

## 2018-06-14 DIAGNOSIS — Z1231 Encounter for screening mammogram for malignant neoplasm of breast: Secondary | ICD-10-CM

## 2018-07-01 ENCOUNTER — Ambulatory Visit
Admission: RE | Admit: 2018-07-01 | Discharge: 2018-07-01 | Disposition: A | Discharge status: Home / Self Care | Payer: 59 | Source: Ambulatory Visit | Attending: Internal Medicine | Admitting: Internal Medicine

## 2018-07-01 DIAGNOSIS — Z1231 Encounter for screening mammogram for malignant neoplasm of breast: Secondary | ICD-10-CM | POA: Insufficient documentation

## 2018-08-16 ENCOUNTER — Emergency Department
Admission: EM | Admit: 2018-08-16 | Discharge: 2018-08-16 | Disposition: A | Discharge status: Home / Self Care | Payer: 59 | Attending: Emergency Medicine | Admitting: Emergency Medicine

## 2018-08-16 ENCOUNTER — Emergency Department: Payer: 59

## 2018-08-16 ENCOUNTER — Other Ambulatory Visit: Payer: Self-pay

## 2018-08-16 DIAGNOSIS — I10 Essential (primary) hypertension: Secondary | ICD-10-CM | POA: Diagnosis not present

## 2018-08-16 DIAGNOSIS — Y999 Unspecified external cause status: Secondary | ICD-10-CM | POA: Insufficient documentation

## 2018-08-16 DIAGNOSIS — Z79899 Other long term (current) drug therapy: Secondary | ICD-10-CM | POA: Insufficient documentation

## 2018-08-16 DIAGNOSIS — S060X0A Concussion without loss of consciousness, initial encounter: Secondary | ICD-10-CM | POA: Diagnosis not present

## 2018-08-16 DIAGNOSIS — W01198A Fall on same level from slipping, tripping and stumbling with subsequent striking against other object, initial encounter: Secondary | ICD-10-CM | POA: Insufficient documentation

## 2018-08-16 DIAGNOSIS — S0101XA Laceration without foreign body of scalp, initial encounter: Secondary | ICD-10-CM | POA: Diagnosis not present

## 2018-08-16 DIAGNOSIS — Y93K1 Activity, walking an animal: Secondary | ICD-10-CM | POA: Diagnosis not present

## 2018-08-16 DIAGNOSIS — Y9289 Other specified places as the place of occurrence of the external cause: Secondary | ICD-10-CM | POA: Diagnosis not present

## 2018-08-16 DIAGNOSIS — R52 Pain, unspecified: Secondary | ICD-10-CM | POA: Diagnosis not present

## 2018-08-16 DIAGNOSIS — S199XXA Unspecified injury of neck, initial encounter: Secondary | ICD-10-CM | POA: Diagnosis not present

## 2018-08-16 DIAGNOSIS — W19XXXA Unspecified fall, initial encounter: Secondary | ICD-10-CM | POA: Diagnosis not present

## 2018-08-16 DIAGNOSIS — S0990XA Unspecified injury of head, initial encounter: Secondary | ICD-10-CM | POA: Diagnosis not present

## 2018-08-16 DIAGNOSIS — Z87891 Personal history of nicotine dependence: Secondary | ICD-10-CM | POA: Diagnosis not present

## 2018-08-16 DIAGNOSIS — S0003XA Contusion of scalp, initial encounter: Secondary | ICD-10-CM | POA: Diagnosis not present

## 2018-08-16 DIAGNOSIS — R030 Elevated blood-pressure reading, without diagnosis of hypertension: Secondary | ICD-10-CM | POA: Diagnosis not present

## 2018-08-16 DIAGNOSIS — R51 Headache: Secondary | ICD-10-CM | POA: Diagnosis not present

## 2018-08-16 DIAGNOSIS — S80211A Abrasion, right knee, initial encounter: Secondary | ICD-10-CM | POA: Diagnosis not present

## 2018-08-16 MED ORDER — KETOROLAC TROMETHAMINE 30 MG/ML IJ SOLN
30.0000 mg | Freq: Once | INTRAMUSCULAR | Status: AC
  Administered 2018-08-16: 30 mg via INTRAVENOUS
  Filled 2018-08-16: qty 1

## 2018-08-16 MED ORDER — ACETAMINOPHEN 500 MG PO TABS
ORAL_TABLET | ORAL | Status: AC
  Filled 2018-08-16: qty 2

## 2018-08-16 MED ORDER — ACETAMINOPHEN 500 MG PO TABS
1000.0000 mg | ORAL_TABLET | Freq: Once | ORAL | Status: AC
  Administered 2018-08-16: 1000 mg via ORAL

## 2018-08-16 NOTE — ED Provider Notes (Signed)
[facility] Emergency Department Provider Note  Time seen: 9:40 PM  I have reviewed the triage vital signs and the nursing notes.   HISTORY  Chief Complaint Fall    HPI Jennifer Mclaughlin is a 47 y.o. female with a past medical history of anemia who presents to the emergency department after a head injury.  According to the patient she just got home from work and she was walking her dogs.  She is not sure what happened but somehow she slipped falling hitting the right side of her head on the ground.  Is not sure if she lost consciousness.  EMS states they were called to the scene and found the patient to be somewhat confused at this time, although the patient is alert and oriented upon arrival to the emergency department.  Patient's only complaint is of an abrasion to her right knee and pain to the right side of her head.   Past Medical History:  Diagnosis Date  . Anemia     There are no active problems to display for this patient.   Past Surgical History:  Procedure Laterality Date  . CHOLECYSTECTOMY    . NASAL SINUS SURGERY    . TONSILLECTOMY    . TUBAL LIGATION      Prior to Admission medications   Medication Sig Start Date End Date Taking? Authorizing Provider  albuterol (PROVENTIL HFA;VENTOLIN HFA) 108 (90 Base) MCG/ACT inhaler Inhale 2 puffs into the lungs every 6 (six) hours as needed for wheezing or shortness of breath. Patient not taking: Reported on 05/04/2018 02/08/17   Versie Starks, PA-C  ALPRAZolam Duanne Moron) 0.5 MG tablet  01/06/17   [provider]  azithromycin (ZITHROMAX Z-PAK) 250 MG tablet As directed Patient not taking: Reported on 05/04/2018 03/08/17   Blima Singer, NP  cetirizine (ZYRTEC) 10 MG tablet Take 10 mg by mouth daily.    [provider]  cyanocobalamin (,VITAMIN B-12,) 1000 MCG/ML injection Inject into the muscle. 10/16/15   [provider]  cyclobenzaprine (FLEXERIL) 10 MG tablet Take 1 tablet (10  mg total) by mouth 3 (three) times daily as needed for muscle spasms. Patient not taking: Reported on 03/08/2017 05/29/16   Versie Starks, PA-C  doxycycline (VIBRA-TABS) 100 MG tablet Take 1 tablet (100 mg total) by mouth 2 (two) times daily. 05/04/18   Kennyth Arnold, FNP  fluticasone Asencion Islam) 50 MCG/ACT nasal spray  02/05/17   [provider]  ibuprofen (ADVIL,MOTRIN) 600 MG tablet Take 1 tablet (600 mg total) by mouth every 8 (eight) hours as needed. Patient not taking: Reported on 05/04/2018 10/11/15   Sable Feil, PA-C  Melatonin (MELATONIN MAXIMUM STRENGTH) 5 MG TABS Take by mouth.    [provider]  methylPREDNISolone (MEDROL DOSEPAK) 4 MG TBPK tablet Take 6 pills on day one then decrease by 1 pill each day Patient not taking: Reported on 03/08/2017 02/08/17   Versie Starks, PA-C  omeprazole (PRILOSEC) 40 MG capsule Take by mouth. 05/21/15 05/20/16  [provider]  Syringe/Needle, Disp, (SYRINGE 3CC/25GX1") 25G X 1" 3 ML MISC  12/11/15   [provider]    Allergies  Allergen Reactions  . Biaxin [Clarithromycin] Swelling  . Ceftin [Cefuroxime Axetil] Swelling  . Penicillins Hives  . Septra [Sulfamethoxazole-Trimethoprim] Swelling  . Sulfa Antibiotics Swelling    Other reaction(s): Unknown  . Amoxicillin     Other reaction(s): Unknown  . Dicyclomine     Other reaction(s): Other (See  Comments) Flushing  . Hydrochlorothiazide     Other reaction(s): Other (See Comments) Swollen tongue  . Linaclotide Diarrhea    Flushing  . Nebivolol     Other reaction(s): Unknown  . Telmisartan     Other reaction(s): Unknown    Family History  Problem Relation Age of Onset  . Thyroid cancer Father   . Breast cancer Neg Hx     Social History Social History   Tobacco Use  . Smoking status: Former Research scientist (life sciences)  . Smokeless tobacco: Never Used  Substance Use Topics  . Alcohol use: Yes    Alcohol/week: 0.0 standard drinks    Comment: occas  . Drug use: No     Review of Systems Constitutional: Unclear loss of consciousness Cardiovascular: Negative for chest pain. Respiratory: Negative for shortness of breath. Gastrointestinal: Negative for abdominal pain Genitourinary: Negative for urinary compaints Musculoskeletal: Negative for musculoskeletal complaints Skin: Abrasion to right knee.  Laceration right scalp Neurological: Mild headache All other ROS negative  ____________________________________________   PHYSICAL EXAM:  Constitutional: Alert and oriented, no distress at this time.  Speaking clearly. Eyes: Normal exam ENT   Head: Punctate laceration/abrasion to right frontal scalp   Nose: No congestion/rhinnorhea.  No septal hematoma.   Mouth/Throat: Mucous membranes are moist.  No oral injuries identified Cardiovascular: Normal rate, regular rhythm. No murmurs, rubs, or gallops. Respiratory: Normal respiratory effort without tachypnea nor retractions. Breath sounds are clear  Gastrointestinal: Soft and nontender. No distention. Musculoskeletal: Nontender with normal range of motion in all extremities.  Neurologic:  Normal speech and language. No gross focal neurologic deficits Skin: Small abrasion overlying right knee. Psychiatric: Mood and affect are normal.   ____________________________________________  EKG viewed and interpreted by myself shows a normal sinus rhythm 85 bpm with a narrow QRS, normal axis, normal intervals, no concerning ST changes.   RADIOLOGY  CT scans are negative  ____________________________________________   INITIAL IMPRESSION / ASSESSMENT AND PLAN / ED COURSE  Pertinent labs & imaging results that were available during my care of the patient were reviewed by me and considered in my medical decision making (see chart for details).  Patient presents to the emergency department for a fall with a head injury now with mild confusion although much improved upon arrival to the emergency  department.  Differential would include ICH, concussion, closed head injury.  We will obtain CT imaging of the head and neck as a precaution.  Overall the patient appears very well at this time.  CT scans are negative we will discharge.  Highly suspect concussion.  ____________________________________________   FINAL CLINICAL IMPRESSION(S) / ED DIAGNOSES  Fall Head injury Concussion   Harvest Dark, MD 08/16/18 2309

## 2018-08-16 NOTE — ED Notes (Signed)
Patient up to use restroom 

## 2018-08-16 NOTE — ED Triage Notes (Signed)
Patient arrived to ED by [facility] from home. Pt had a fall. Patient unable to remember what happen or today's date or upcoming holiday. Patient has LAC to rt side of head and scraps generalized over body from fall. Patient complaining of headache 6/10 and some rt knee pain.

## 2018-08-16 NOTE — ED Notes (Signed)
Patient transported to CT 

## 2018-08-18 DIAGNOSIS — R1012 Left upper quadrant pain: Secondary | ICD-10-CM | POA: Diagnosis not present

## 2018-08-18 DIAGNOSIS — S0511XA Contusion of eyeball and orbital tissues, right eye, initial encounter: Secondary | ICD-10-CM | POA: Diagnosis not present

## 2018-08-18 DIAGNOSIS — F0781 Postconcussional syndrome: Secondary | ICD-10-CM | POA: Diagnosis not present

## 2018-08-25 DIAGNOSIS — F0781 Postconcussional syndrome: Secondary | ICD-10-CM | POA: Diagnosis not present

## 2018-08-25 DIAGNOSIS — S0101XD Laceration without foreign body of scalp, subsequent encounter: Secondary | ICD-10-CM | POA: Diagnosis not present

## 2018-08-25 DIAGNOSIS — S0511XD Contusion of eyeball and orbital tissues, right eye, subsequent encounter: Secondary | ICD-10-CM | POA: Diagnosis not present

## 2018-12-01 DIAGNOSIS — E039 Hypothyroidism, unspecified: Secondary | ICD-10-CM | POA: Diagnosis not present

## 2018-12-01 DIAGNOSIS — F411 Generalized anxiety disorder: Secondary | ICD-10-CM | POA: Diagnosis not present

## 2018-12-01 DIAGNOSIS — I10 Essential (primary) hypertension: Secondary | ICD-10-CM | POA: Diagnosis not present

## 2018-12-01 DIAGNOSIS — G4700 Insomnia, unspecified: Secondary | ICD-10-CM | POA: Diagnosis not present

## 2019-01-30 DIAGNOSIS — Z1239 Encounter for other screening for malignant neoplasm of breast: Secondary | ICD-10-CM | POA: Diagnosis not present

## 2019-01-30 DIAGNOSIS — Z1211 Encounter for screening for malignant neoplasm of colon: Secondary | ICD-10-CM | POA: Diagnosis not present

## 2019-01-30 DIAGNOSIS — Z01419 Encounter for gynecological examination (general) (routine) without abnormal findings: Secondary | ICD-10-CM | POA: Diagnosis not present

## 2019-03-14 ENCOUNTER — Emergency Department
Admission: EM | Admit: 2019-03-14 | Discharge: 2019-03-14 | Disposition: A | Discharge status: Home / Self Care | Payer: 59 | Attending: Emergency Medicine | Admitting: Emergency Medicine

## 2019-03-14 ENCOUNTER — Other Ambulatory Visit: Payer: Self-pay

## 2019-03-14 ENCOUNTER — Encounter: Payer: Self-pay | Admitting: Emergency Medicine

## 2019-03-14 ENCOUNTER — Emergency Department: Payer: 59

## 2019-03-14 DIAGNOSIS — M25512 Pain in left shoulder: Secondary | ICD-10-CM | POA: Insufficient documentation

## 2019-03-14 DIAGNOSIS — Z79899 Other long term (current) drug therapy: Secondary | ICD-10-CM | POA: Insufficient documentation

## 2019-03-14 DIAGNOSIS — Z87891 Personal history of nicotine dependence: Secondary | ICD-10-CM | POA: Insufficient documentation

## 2019-03-14 MED ORDER — PREDNISONE 10 MG PO TABS: ORAL_TABLET | ORAL | 0 refills | Status: DC

## 2019-03-14 MED ORDER — HYDROCODONE-ACETAMINOPHEN 5-325 MG PO TABS: 1.0000 | ORAL_TABLET | Freq: Four times a day (QID) | ORAL | 0 refills | Status: DC | PRN

## 2019-03-14 MED ORDER — OXYCODONE-ACETAMINOPHEN 5-325 MG PO TABS
1.0000 | ORAL_TABLET | Freq: Once | ORAL | Status: AC
  Administered 2019-03-14: 09:00:00 1 via ORAL
  Filled 2019-03-14: qty 1

## 2019-03-14 NOTE — ED Provider Notes (Signed)
[facility] Emergency Department Provider Note  ____________________________________________   First MD Initiated Contact with Patient 03/14/19 [phone]     (approximate)  I have reviewed the triage vital signs and the nursing notes.   HISTORY  Chief Complaint No chief complaint on file.   HPI Jennifer Mclaughlin is a 48 y.o. female presents to the ED with complaint of left shoulder pain that began during the night.  Patient states that she had a B12 injection yesterday.  She denies any injury.  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  She has not taken any over-the-counter medication.  She is having great deal difficulty abducting her arm.  She rates her pain as a 10/10.     Past Medical History:  Diagnosis Date  . Anemia     There are no active problems to display for this patient.   Past Surgical History:  Procedure Laterality Date  . CHOLECYSTECTOMY    . NASAL SINUS SURGERY    . TONSILLECTOMY    . TUBAL LIGATION      Prior to Admission medications   Medication Sig Start Date End Date Taking? Authorizing Provider  ALPRAZolam Duanne Moron) 0.5 MG tablet  01/06/17   [provider]  cetirizine (ZYRTEC) 10 MG tablet Take 10 mg by mouth daily.    [provider]  cyanocobalamin (,VITAMIN B-12,) 1000 MCG/ML injection Inject into the muscle. 10/16/15   [provider]  fluticasone Asencion Islam) 50 MCG/ACT nasal spray  02/05/17   [provider]  HYDROcodone-acetaminophen (NORCO/VICODIN) 5-325 MG tablet Take 1 tablet by mouth every 6 (six) hours as needed. 03/14/19   Johnn Hai, PA-C  Melatonin (MELATONIN MAXIMUM STRENGTH) 5 MG TABS Take by mouth.    [provider]  omeprazole (PRILOSEC) 40 MG capsule Take by mouth. 05/21/15 05/20/16  [provider]  predniSONE (DELTASONE) 10 MG tablet Take 6 tablets  today, on day 2 take 5 tablets, day 3 take 4 tablets, day 4 take 3 tablets, day 5 take  2 tablets and  1 tablet the last day 03/14/19   Johnn Hai, PA-C  Syringe/Needle, Disp, (SYRINGE 3CC/25GX1") 25G X 1" 3 ML MISC  12/11/15   [provider]    Allergies Biaxin [clarithromycin], Ceftin [cefuroxime axetil], Penicillins, Septra [sulfamethoxazole-trimethoprim], Sulfa antibiotics, Amoxicillin, Dicyclomine, Hydrochlorothiazide, Linaclotide, Nebivolol, and Telmisartan  Family History  Problem Relation Age of Onset  . Thyroid cancer Father   . Breast cancer Neg Hx     Social History Social History   Tobacco Use  . Smoking status: Former Research scientist (life sciences)  . Smokeless tobacco: Never Used  Substance Use Topics  . Alcohol use: Yes    Alcohol/week: 0.0 standard drinks    Comment: occas  . Drug use: No    Review of Systems Constitutional: No fever/chills Cardiovascular: Denies chest pain. Respiratory: Denies shortness of breath. Musculoskeletal: Positive left shoulder pain. Skin: Negative for rash. Neurological: Negative for headaches, focal weakness or numbness. ___________________________________________   PHYSICAL EXAM:  VITAL SIGNS: ED Triage Vitals  Enc Vitals Group     BP 03/14/19 0652 126/87     Pulse Rate 03/14/19 0652 99     Resp 03/14/19 0652 18     Temp 03/14/19 0652 98.8 F (37.1 C)     Temp Source 03/14/19 0652 Oral     SpO2 03/14/19 0652 96 %     Weight 03/14/19 0651 160 lb (72.6 kg)     Height 03/14/19 0651  5\' 3"  (1.6 m)     Head Circumference --      Peak Flow --      Pain Score 03/14/19 0651 10     Pain Loc --      Pain Edu? --      Excl. in [place]? --     Constitutional: Alert and oriented. Well appearing and in no acute distress. Eyes: Conjunctivae are normal.  Head: Atraumatic. Neck: No stridor.   Cardiovascular: Normal rate, regular rhythm. Grossly normal heart sounds.  Good peripheral circulation. Respiratory: Normal respiratory effort.  No retractions. Lungs CTAB. Musculoskeletal: Examination of the left shoulder there is no gross deformity  however range of motion is markedly restricted secondary to discomfort.  Patient is unable to abduct as having great deal difficulty with any range of motion passively.  There is no warmth or redness in the area.  Skin is intact.  No discoloration is noted.  Pulses present.  Motor sensory function intact distal to the area. Neurologic:  Normal speech and language. No gross focal neurologic deficits are appreciated.  Skin:  Skin is warm, dry and intact.  No skin discoloration. Psychiatric: Mood and affect are normal. Speech and behavior are normal.  ____________________________________________   LABS (all labs ordered are listed, but only abnormal results are displayed)  Labs Reviewed - No data to display  RADIOLOGY  ED MD interpretation:   Left shoulder x-ray is negative for any acute bony injury.  Official radiology report(s): Dg Shoulder Left  Result Date: 03/14/2019 CLINICAL DATA:  B12 injection yesterday in the left arm now with anterior shoulder pain. EXAM: LEFT SHOULDER - 2+ VIEW COMPARISON:  None. FINDINGS: No fracture or dislocation. Glenohumeral acromioclavicular joint spaces appear preserved. No evidence of calcific tendinitis. Regional soft tissues appear normal.  No radiopaque foreign body Limited visualization adjacent thorax is normal. IMPRESSION: No fracture or radiopaque foreign body. Electronically Signed   By: Sandi Mariscal M.D.   On: 03/14/2019 07:54    ____________________________________________   PROCEDURES  Procedure(s) performed (including Critical Care):  Procedures   ____________________________________________   INITIAL IMPRESSION / ASSESSMENT AND PLAN / ED COURSE  As part of my medical decision making, I reviewed the following data within the electronic MEDICAL RECORD NUMBER Notes from prior ED visits and [place] Controlled Substance Database  48 year old female presents to the ED with complaint of sudden onset of left arm pain that occurred early in the a.m.   Patient has used ice and heat without any relief.  She states that there is been no acute injury but she did get a B12 shot yesterday.  She states that she has had difficulty raising her left arm without assistance of her right arm.  Range of motion is moderately restricted secondary to pain.  X-ray was negative for any acute bony changes.  Patient was given Percocet 5 mg while in the ED.  She is continue with Vicodin 5 mg every 6 hours as needed for pain and began taking prednisone 6-day taper.  She is to follow-up with her PCP if any continued problems.  ____________________________________________   FINAL CLINICAL IMPRESSION(S) / ED DIAGNOSES  Final diagnoses:  Acute pain of left shoulder     ED Discharge Orders         Ordered    HYDROcodone-acetaminophen (NORCO/VICODIN) 5-325 MG tablet  Every 6 hours PRN,   Status:  Discontinued     03/14/19 0840    predniSONE (DELTASONE) 10 MG tablet  03/14/19 0840    HYDROcodone-acetaminophen (NORCO/VICODIN) 5-325 MG tablet  Every 6 hours PRN     03/14/19 0848           Note:  This document was prepared using Dragon voice recognition software and may include unintentional dictation errors.    Johnn Hai, PA-C 03/14/19 1057    Vanessa Latham, MD 03/14/19 1810

## 2019-03-14 NOTE — ED Notes (Signed)
See triage note  Presents with pain to left arm  States she received a shot in left deltoid yesterday  Had some pain at first  But now states having increased pain and unable to raise arm

## 2019-03-14 NOTE — ED Triage Notes (Signed)
Patient ambulatory to triage with steady gait, without difficulty or distress noted, mask in place; pt reports left arm pain with ROM after receiving B12 injection yesterday

## 2019-03-14 NOTE — Discharge Instructions (Signed)
Follow-up with your primary care provider if any continued problems or not improving.  Begin taking prednisone today starting with 6 tablets and tapering down.  The pain medication is every 6 hours as needed.  Take this medication with food and only when you are at home.  Do not drive or operate machinery while taking the pain medication as it could cause drowsiness and increase your risk for injury.  You may also continue with ice or heat as needed for your shoulder pain.  No lifting with your left arm at this time.

## 2019-04-21 DIAGNOSIS — E039 Hypothyroidism, unspecified: Secondary | ICD-10-CM | POA: Diagnosis not present

## 2019-04-21 DIAGNOSIS — I10 Essential (primary) hypertension: Secondary | ICD-10-CM | POA: Diagnosis not present

## 2019-04-21 DIAGNOSIS — E538 Deficiency of other specified B group vitamins: Secondary | ICD-10-CM | POA: Diagnosis not present

## 2019-04-21 DIAGNOSIS — Z79899 Other long term (current) drug therapy: Secondary | ICD-10-CM | POA: Diagnosis not present

## 2019-04-21 DIAGNOSIS — Z0000 Encounter for general adult medical examination without abnormal findings: Secondary | ICD-10-CM | POA: Diagnosis not present

## 2019-04-21 DIAGNOSIS — E7800 Pure hypercholesterolemia, unspecified: Secondary | ICD-10-CM | POA: Diagnosis not present

## 2019-05-18 ENCOUNTER — Other Ambulatory Visit: Payer: Self-pay | Admitting: Obstetrics and Gynecology

## 2019-05-18 DIAGNOSIS — Z1231 Encounter for screening mammogram for malignant neoplasm of breast: Secondary | ICD-10-CM

## 2019-07-05 ENCOUNTER — Ambulatory Visit
Admission: RE | Admit: 2019-07-05 | Discharge: 2019-07-05 | Disposition: A | Discharge status: Home / Self Care | Payer: 59 | Source: Ambulatory Visit | Attending: Obstetrics and Gynecology | Admitting: Obstetrics and Gynecology

## 2019-07-05 DIAGNOSIS — Z1231 Encounter for screening mammogram for malignant neoplasm of breast: Secondary | ICD-10-CM | POA: Insufficient documentation

## 2019-07-07 DIAGNOSIS — E538 Deficiency of other specified B group vitamins: Secondary | ICD-10-CM | POA: Diagnosis not present

## 2019-11-15 DIAGNOSIS — M5442 Lumbago with sciatica, left side: Secondary | ICD-10-CM | POA: Diagnosis not present

## 2019-11-15 DIAGNOSIS — M62830 Muscle spasm of back: Secondary | ICD-10-CM | POA: Diagnosis not present

## 2019-11-15 DIAGNOSIS — M5127 Other intervertebral disc displacement, lumbosacral region: Secondary | ICD-10-CM | POA: Diagnosis not present

## 2019-11-15 DIAGNOSIS — M9903 Segmental and somatic dysfunction of lumbar region: Secondary | ICD-10-CM | POA: Diagnosis not present

## 2020-01-12 DIAGNOSIS — Z79899 Other long term (current) drug therapy: Secondary | ICD-10-CM | POA: Diagnosis not present

## 2020-01-12 DIAGNOSIS — E039 Hypothyroidism, unspecified: Secondary | ICD-10-CM | POA: Diagnosis not present

## 2020-01-12 DIAGNOSIS — D519 Vitamin B12 deficiency anemia, unspecified: Secondary | ICD-10-CM | POA: Diagnosis not present

## 2020-01-12 DIAGNOSIS — I10 Essential (primary) hypertension: Secondary | ICD-10-CM | POA: Diagnosis not present

## 2020-01-12 DIAGNOSIS — E538 Deficiency of other specified B group vitamins: Secondary | ICD-10-CM | POA: Diagnosis not present

## 2020-02-06 DIAGNOSIS — Z01419 Encounter for gynecological examination (general) (routine) without abnormal findings: Secondary | ICD-10-CM | POA: Diagnosis not present

## 2020-05-21 ENCOUNTER — Other Ambulatory Visit: Payer: Self-pay | Admitting: Obstetrics and Gynecology

## 2020-05-21 DIAGNOSIS — Z1231 Encounter for screening mammogram for malignant neoplasm of breast: Secondary | ICD-10-CM

## 2020-06-02 ENCOUNTER — Other Ambulatory Visit: Payer: Self-pay | Admitting: Internal Medicine

## 2020-07-08 ENCOUNTER — Ambulatory Visit
Admission: RE | Admit: 2020-07-08 | Discharge: 2020-07-08 | Disposition: A | Discharge status: Home / Self Care | Payer: 59 | Source: Ambulatory Visit | Attending: Obstetrics and Gynecology | Admitting: Obstetrics and Gynecology

## 2020-07-08 ENCOUNTER — Other Ambulatory Visit: Payer: Self-pay

## 2020-07-08 DIAGNOSIS — Z1231 Encounter for screening mammogram for malignant neoplasm of breast: Secondary | ICD-10-CM | POA: Diagnosis not present

## 2020-07-19 ENCOUNTER — Other Ambulatory Visit: Payer: Self-pay | Admitting: Internal Medicine

## 2020-11-19 ENCOUNTER — Other Ambulatory Visit: Payer: Self-pay | Admitting: Internal Medicine

## 2020-12-02 ENCOUNTER — Other Ambulatory Visit: Payer: Self-pay

## 2020-12-02 ENCOUNTER — Emergency Department
Admission: EM | Admit: 2020-12-02 | Discharge: 2020-12-02 | Disposition: A | Discharge status: Home / Self Care | Payer: 59 | Attending: Emergency Medicine | Admitting: Emergency Medicine

## 2020-12-02 ENCOUNTER — Other Ambulatory Visit (INDEPENDENT_AMBULATORY_CARE_PROVIDER_SITE_OTHER): Payer: Self-pay | Admitting: Emergency Medicine

## 2020-12-02 ENCOUNTER — Emergency Department: Payer: 59

## 2020-12-02 ENCOUNTER — Telehealth: Payer: Self-pay | Admitting: Emergency Medicine

## 2020-12-02 ENCOUNTER — Encounter: Payer: Self-pay | Admitting: Emergency Medicine

## 2020-12-02 DIAGNOSIS — Y9353 Activity, golf: Secondary | ICD-10-CM | POA: Insufficient documentation

## 2020-12-02 DIAGNOSIS — X58XXXA Exposure to other specified factors, initial encounter: Secondary | ICD-10-CM | POA: Insufficient documentation

## 2020-12-02 DIAGNOSIS — S46911A Strain of unspecified muscle, fascia and tendon at shoulder and upper arm level, right arm, initial encounter: Secondary | ICD-10-CM | POA: Insufficient documentation

## 2020-12-02 DIAGNOSIS — Z87891 Personal history of nicotine dependence: Secondary | ICD-10-CM | POA: Insufficient documentation

## 2020-12-02 DIAGNOSIS — S46819A Strain of other muscles, fascia and tendons at shoulder and upper arm level, unspecified arm, initial encounter: Secondary | ICD-10-CM

## 2020-12-02 DIAGNOSIS — S4991XA Unspecified injury of right shoulder and upper arm, initial encounter: Secondary | ICD-10-CM | POA: Diagnosis present

## 2020-12-02 MED ORDER — KETOROLAC TROMETHAMINE 30 MG/ML IJ SOLN
30.0000 mg | Freq: Once | INTRAMUSCULAR | Status: AC
  Administered 2020-12-02: 30 mg via INTRAMUSCULAR
  Filled 2020-12-02: qty 1

## 2020-12-02 MED ORDER — HYDROCODONE-ACETAMINOPHEN 5-325 MG PO TABS: 1.0000 | ORAL_TABLET | Freq: Four times a day (QID) | ORAL | 0 refills | Status: DC | PRN

## 2020-12-02 MED ORDER — PREDNISONE 50 MG PO TABS: 50.0000 mg | ORAL_TABLET | Freq: Every day | ORAL | 0 refills | Status: DC

## 2020-12-02 MED ORDER — PREDNISONE 20 MG PO TABS
60.0000 mg | ORAL_TABLET | Freq: Once | ORAL | Status: AC
  Administered 2020-12-02: 60 mg via ORAL
  Filled 2020-12-02: qty 3

## 2020-12-02 MED ORDER — OXYCODONE-ACETAMINOPHEN 5-325 MG PO TABS
1.0000 | ORAL_TABLET | ORAL | Status: DC | PRN
  Administered 2020-12-02: 1 via ORAL
  Filled 2020-12-02: qty 1

## 2020-12-02 NOTE — Telephone Encounter (Signed)
Added medication 

## 2020-12-02 NOTE — ED Triage Notes (Signed)
Emergency Medicine Provider Triage Evaluation Note  Jennifer Mclaughlin , a 50 y.o. female  was evaluated in triage.  Pt complains of right shoulder pain worsening over two days.  Some mild numbness in fingers.  Right hand dominant.  Pain is severe, not helped by OTC medication.    Review of Systems  Positive: right shoulder pain, mild numbness in right fingers Negative: history of trauma, neck pain, back pain  Physical Exam  BP (!) 161/105 (BP Location: Right Arm)   Pulse 85   Temp 98.5 F (36.9 C) (Oral)   Resp (!) 22   Ht 1.6 m (5\' 3" )   Wt 73.5 kg   SpO2 95%   BMI 28.70 kg/m  Gen:   Awake, appears to be in pain, tearful HEENT:  Atraumatic  Resp:  Normal effort  Cardiac:  Normal rate  Abd:   Nondistended, nontender  MSK:   Favoring right arm, now in sling, limited ROM Neuro:  Speech clear   Medical Decision Making  Medically screening exam initiated at 6:32 AM.  Appropriate orders placed.  Jennifer Mclaughlin was informed that the remainder of the evaluation will be completed by another provider, this initial triage assessment does not replace that evaluation, and the importance of remaining in the ED until their evaluation is complete.  Clinical Impression  Right shoulder strain, xrays pending, suspect numbness is due to pain rather than acute N/V compromise.  No compartment syndrome.   Hinda Kehr, MD 12/02/20 [phone]

## 2020-12-02 NOTE — ED Provider Notes (Signed)
[facility] Emergency Department Provider Note   ____________________________________________    I have reviewed the triage vital signs and the nursing notes.   HISTORY  Chief Complaint Shoulder Injury     HPI Jennifer Mclaughlin is a 50 y.o. female who presents with complaints of right shoulder pain.  Patient reports playing disc golf yesterday with worsening pain overnight particularly in the top of her right shoulder.  She denies any sort of "snapping sensation ".  Frequently plays disc golf.  Took hydrocodone at home with little relief.  No numbness or tingling  Past Medical History:  Diagnosis Date  . Anemia     There are no problems to display for this patient.   Past Surgical History:  Procedure Laterality Date  . CHOLECYSTECTOMY    . NASAL SINUS SURGERY    . TONSILLECTOMY    . TUBAL LIGATION      Prior to Admission medications   Medication Sig Start Date End Date Taking? Authorizing Provider  ALPRAZolam Duanne Moron) 0.5 MG tablet  01/06/17   [provider]  cetirizine (ZYRTEC) 10 MG tablet Take 10 mg by mouth daily.    [provider]  cyanocobalamin (,VITAMIN B-12,) 1000 MCG/ML injection Inject into the muscle. 10/16/15   [provider]  fluticasone Asencion Islam) 50 MCG/ACT nasal spray  02/05/17   [provider]  HYDROcodone-acetaminophen (NORCO/VICODIN) 5-325 MG tablet Take 1 tablet by mouth every 6 (six) hours as needed for severe pain. 12/02/20   Lavonia Drafts, MD  Melatonin (MELATONIN MAXIMUM STRENGTH) 5 MG TABS Take by mouth.    [provider]  omeprazole (PRILOSEC) 40 MG capsule Take by mouth. 05/21/15 05/20/16  [provider]  predniSONE (DELTASONE) 10 MG tablet Take 6 tablets  today, on day 2 take 5 tablets, day 3 take 4 tablets, day 4 take 3 tablets, day 5 take  2 tablets and 1 tablet the last day 03/14/19   Johnn Hai, PA-C  Syringe/Needle, Disp, (SYRINGE 3CC/25GX1") 25G X 1" 3  ML MISC  12/11/15   [provider]     Allergies Biaxin [clarithromycin], Ceftin [cefuroxime axetil], Penicillins, Septra [sulfamethoxazole-trimethoprim], Sulfa antibiotics, Amoxicillin, Dicyclomine, Hydrochlorothiazide, Linaclotide, Nebivolol, and Telmisartan  Family History  Problem Relation Age of Onset  . Thyroid cancer Father   . Breast cancer Neg Hx     Social History Social History   Tobacco Use  . Smoking status: Former Research scientist (life sciences)  . Smokeless tobacco: Never Used  Substance Use Topics  . Alcohol use: Yes    Alcohol/week: 0.0 standard drinks    Comment: occas  . Drug use: No    Review of Systems  Constitutional: No dizziness    Musculoskeletal: As above Skin: Negative for rash. Neurological: Negative for numbness    ____________________________________________   PHYSICAL EXAM:  VITAL SIGNS: ED Triage Vitals [12/02/20 0600]  Enc Vitals Group     BP (!) 161/105     Pulse Rate 85     Resp (!) 22     Temp 98.5 F (36.9 C)     Temp Source Oral     SpO2 95 %     Weight 73.5 kg (162 lb)     Height 1.6 m (5\' 3" )     Head Circumference      Peak Flow      Pain Score 10     Pain Loc      Pain Edu?      Excl. in  GC?      Constitutional: Alert and oriented.  Eyes: Conjunctivae are normal.  Head: Atraumatic.    Cardiovascular: Normal rate, regular rhythm.  Respiratory: Normal respiratory effort.  No retractions.  Musculoskeletal: Tenderness to palpation along the right deltoid particularly along the tendon, patient is able to abduct her arm but it is quite painful.  No rash or significant swelling.  Arm is soft and well perfused, 2+ distal pulses Neurologic:  Normal speech and language. No gross focal neurologic deficits are appreciated.   Skin:  Skin is warm, dry and intact. No rash noted.   ____________________________________________   LABS (all labs ordered are listed, but only abnormal results are displayed)  Labs Reviewed - No data  to display ____________________________________________  EKG   ____________________________________________  RADIOLOGY  X-ray viewed by me, no bony abnormalities ____________________________________________   PROCEDURES  Procedure(s) performed: No  Procedures   Critical Care performed: No ____________________________________________   INITIAL IMPRESSION / ASSESSMENT AND PLAN / ED COURSE  Pertinent labs & imaging results that were available during my care of the patient were reviewed by me and considered in my medical decision making (see chart for details).  Patient presents with exam as above, consistent with deltoid strain, recommend ice, rest, IM Toradol given here with p.o. prednisone, analgesics Rx, outpatient follow-up with Ortho if no improvement over the next several days.  Work note provided.   ____________________________________________   FINAL CLINICAL IMPRESSION(S) / ED DIAGNOSES  Final diagnoses:  Strain of deltoid muscle, initial encounter      NEW MEDICATIONS STARTED DURING THIS VISIT:  Current Discharge Medication List       Note:  This document was prepared using Dragon voice recognition software and may include unintentional dictation errors.   Lavonia Drafts, MD 12/02/20 [phone]

## 2020-12-02 NOTE — ED Triage Notes (Signed)
the patient c/o right shoulder pain after playing corn hole on Saturday with soreness and then Sunday played disc golf with increased pain - pt reports taking half flexeril and old prescription of half hydrocodone without any relief of pain  Pt has limited ability to move right side arm   Pt in obvious pain

## 2020-12-10 ENCOUNTER — Other Ambulatory Visit: Payer: Self-pay

## 2020-12-10 MED ORDER — LACTULOSE 10 GM/15ML PO SOLN
ORAL | 0 refills | Status: DC
  Filled 2020-12-10: qty 600, 10d supply, fill #0

## 2020-12-12 ENCOUNTER — Other Ambulatory Visit: Payer: Self-pay

## 2020-12-12 MED FILL — Cyanocobalamin Inj 1000 MCG/ML: INTRAMUSCULAR | 56 days supply | Qty: 4 | Fill #0 | Status: CN

## 2020-12-25 ENCOUNTER — Other Ambulatory Visit: Payer: Self-pay

## 2020-12-25 MED FILL — Alprazolam Tab 0.5 MG: ORAL | 30 days supply | Qty: 30 | Fill #0 | Status: AC

## 2021-01-08 ENCOUNTER — Other Ambulatory Visit: Payer: Self-pay

## 2021-01-08 MED FILL — Cyanocobalamin Inj 1000 MCG/ML: INTRAMUSCULAR | 56 days supply | Qty: 4 | Fill #0 | Status: CN

## 2021-01-14 ENCOUNTER — Other Ambulatory Visit: Payer: Self-pay

## 2021-01-14 ENCOUNTER — Ambulatory Visit (INDEPENDENT_AMBULATORY_CARE_PROVIDER_SITE_OTHER): Payer: Self-pay | Admitting: Dermatology

## 2021-01-14 DIAGNOSIS — L988 Other specified disorders of the skin and subcutaneous tissue: Secondary | ICD-10-CM

## 2021-01-14 DIAGNOSIS — L7211 Pilar cyst: Secondary | ICD-10-CM

## 2021-01-14 NOTE — Progress Notes (Signed)
   Follow-Up Visit   Subjective  Jennifer Mclaughlin is a 50 y.o. female who presents for the following: Facial Elastosis (Botox consultation and treatment today) and Other (Spot on scalp).  The following portions of the chart were reviewed this encounter and updated as appropriate:   Tobacco  Allergies  Meds  Problems  Med Hx  Surg Hx  Fam Hx     Review of Systems:  No other skin or systemic complaints except as noted in HPI or Assessment and Plan.  Objective  Well appearing patient in no apparent distress; mood and affect are within normal limits.  A focused examination was performed including face, scalp. Relevant physical exam findings are noted in the Assessment and Plan.  Objective  face: Rhytides and volume loss.   Images        Objective  Scalp: Subcutaneous nodule at scalp.    Assessment & Plan  Elastosis of skin face  Recommend 27.5 units to frown complex and 10 units each side for crow's feet area.  She will do frown complex today and may consider crow's feet in the future.  Botox Injection - face Location: Frown Complex  Informed consent: Discussed risks (infection, pain, bleeding, bruising, swelling, allergic reaction, paralysis of nearby muscles, eyelid droop, double vision, neck weakness, difficulty breathing, headache, undesirable cosmetic result, and need for additional treatment) and benefits of the procedure, as well as the alternatives.  Informed consent was obtained.  Preparation: The area was cleansed with alcohol.  Procedure Details:  Botox was injected into the dermis with a 30-gauge needle. Pressure applied to any bleeding. Ice packs offered for swelling.  Lot Number:  [id number] Expiration:  01/2023  Total Units Injected:  27.5  Plan: Patient was instructed to remain upright for 4 hours. Patient was instructed to avoid massaging the face and avoid vigorous exercise for the rest of the day. Tylenol may be used for headache.  Allow 2  weeks before returning to clinic for additional dosing as needed. Patient will call for any problems.   Pilar cyst Scalp  Will plan excision  Return in 3 weeks (on 02/04/2021) for Botox f/u, Surgery cyst of scalp.  I, Othelia Pulling, RMA, am acting as scribe for Sarina Ser, MD .  Documentation: I have reviewed the above documentation for accuracy and completeness, and I agree with the above.  Sarina Ser, MD

## 2021-01-14 NOTE — Patient Instructions (Addendum)
If you have any questions or concerns for your doctor, please call our main line at [phone] and press option 4 to reach your doctor's medical assistant. If no one answers, please leave a voicemail as directed and we will return your call as soon as possible. Messages left after 4 pm will be answered the following business day.   You may also send [place] a message via [place]. We typically respond to MyChart messages within 1-2 business days.  For prescription refills, please ask your pharmacy to contact our office. Our fax number is [phone].  If you have an urgent issue when the clinic is closed that cannot wait until the next business day, you can page your doctor at the number below.    Please note that while we do our best to be available for urgent issues outside of office hours, we are not available 24/7.   If you have an urgent issue and are unable to reach [place], you may choose to seek medical care at your doctor's office, retail clinic, urgent care center, or emergency room.  If you have a medical emergency, please immediately call 911 or go to the emergency department.  Pager Numbers  - Dr. Nehemiah Massed: [phone]  - Dr. Laurence Ferrari: [phone]  - Dr. Nicole Kindred: [phone]  In the event of inclement weather, please call our main line at [phone] for an update on the status of any delays or closures.  Dermatology Medication Tips: Please keep the boxes that topical medications come in in order to help keep track of the instructions about where and how to use these. Pharmacies typically print the medication instructions only on the boxes and not directly on the medication tubes.   If your medication is too expensive, please contact our office at [phone] option 4 or send [place] a message through [place].   We are unable to tell what your co-pay for medications will be in advance as this is different depending on your insurance coverage. However, we may be able to find a substitute  medication at lower cost or fill out paperwork to get insurance to cover a needed medication.   If a prior authorization is required to get your medication covered by your insurance company, please allow [place] 1-2 business days to complete this process.  Drug prices often vary depending on where the prescription is filled and some pharmacies may offer cheaper prices.  The website www.goodrx.com contains coupons for medications through different pharmacies. The prices here do not account for what the cost may be with help from insurance (it may be cheaper with your insurance), but the website can give you the price if you did not use any insurance.  - You can print the associated coupon and take it with your prescription to the pharmacy.  - You may also stop by our office during regular business hours and pick up a GoodRx coupon card.  - If you need your prescription sent electronically to a different pharmacy, notify our office through [facility] or by phone at [phone] option 4.     Pre-Operative Instructions  You are scheduled for a surgical procedure at [facility]. We recommend you read the following instructions. If you have any questions or concerns, please call the office at [phone].  1. Shower and wash the entire body with soap and water the day of your surgery paying special attention to cleansing at and around the planned surgery site.  2. Avoid aspirin or aspirin containing products at  least fourteen (14) days prior to your surgical procedure and for at least one week (7 Days) after your surgical procedure. If you take aspirin on a regular basis for heart disease or history of stroke or for any other reason, we may recommend you continue taking aspirin but please notify us if you take this on a regular basis. Aspirin can cause more bleeding to occur during surgery as well as prolonged bleeding and bruising after surgery.   3. Avoid other nonsteroidal pain  medications at least one week prior to surgery and at least one week prior to your surgery. These include medications such as Ibuprofen (Motrin, Advil and Nuprin), Naprosyn, Voltaren, Relafen, etc. If medications are used for therapeutic reasons, please inform us as they can cause increased bleeding or prolonged bleeding during and bruising after surgical procedures.   4. Please advise [place] if you are taking any "blood thinner" medications such as Coumadin or Dipyridamole or Plavix or similar medications. These cause increased bleeding and prolonged bleeding during procedures and bruising after surgical procedures. We may have to consider discontinuing these medications briefly prior to and shortly after your surgery if safe to do so.   5. Please inform us of all medications you are currently taking. All medications that are taken regularly should be taken the day of surgery as you always do. Nevertheless, we need to be informed of what medications you are taking prior to surgery to know whether they will affect the procedure or cause any complications.   6. Please inform us of any medication allergies. Also inform us of whether you have allergies to Latex or rubber products or whether you have had any adverse reaction to Lidocaine or Epinephrine.  7. Please inform us of any prosthetic or artificial body parts such as artificial heart valve, joint replacements, etc., or similar condition that might require preoperative antibiotics.   8. We recommend avoidance of alcohol at least two weeks prior to surgery and continued avoidance for at least two weeks after surgery.   9. We recommend discontinuation of tobacco smoking at least two weeks prior to surgery and continued abstinence for at least two weeks after surgery.  10. Do not plan strenuous exercise, strenuous work or strenuous lifting for approximately four weeks after your surgery.   11. We request if you are unable to make your scheduled surgical  appointment, please call us at least a week in advance or as soon as you are aware of a problem so that we can cancel or reschedule the appointment.   12. You MAY TAKE TYLENOL (acetaminophen) for pain as it is not a blood thinner.   13. PLEASE PLAN TO BE IN TOWN FOR TWO WEEKS FOLLOWING SURGERY, THIS IS IMPORTANT SO YOU CAN BE CHECKED FOR DRESSING CHANGES, SUTURE REMOVAL AND TO MONITOR FOR POSSIBLE COMPLICATIONS.  14.

## 2021-01-20 ENCOUNTER — Other Ambulatory Visit: Payer: Self-pay

## 2021-01-20 MED FILL — Cyanocobalamin Inj 1000 MCG/ML: INTRAMUSCULAR | 56 days supply | Qty: 4 | Fill #0 | Status: AC

## 2021-01-21 ENCOUNTER — Other Ambulatory Visit: Payer: Self-pay

## 2021-01-21 MED FILL — Omeprazole Cap Delayed Release 20 MG: ORAL | 30 days supply | Qty: 30 | Fill #0 | Status: AC

## 2021-01-22 ENCOUNTER — Encounter: Payer: Self-pay | Admitting: Dermatology

## 2021-01-29 ENCOUNTER — Other Ambulatory Visit: Payer: Self-pay

## 2021-01-29 MED FILL — Alprazolam Tab 0.5 MG: ORAL | 30 days supply | Qty: 30 | Fill #1 | Status: AC

## 2021-02-14 ENCOUNTER — Other Ambulatory Visit: Payer: Self-pay | Admitting: Family Medicine

## 2021-02-14 DIAGNOSIS — R413 Other amnesia: Secondary | ICD-10-CM

## 2021-02-18 ENCOUNTER — Other Ambulatory Visit: Payer: Self-pay

## 2021-02-18 ENCOUNTER — Ambulatory Visit (INDEPENDENT_AMBULATORY_CARE_PROVIDER_SITE_OTHER): Payer: 59 | Admitting: Dermatology

## 2021-02-18 DIAGNOSIS — D485 Neoplasm of uncertain behavior of skin: Secondary | ICD-10-CM

## 2021-02-18 DIAGNOSIS — D2239 Melanocytic nevi of other parts of face: Secondary | ICD-10-CM | POA: Diagnosis not present

## 2021-02-18 DIAGNOSIS — D229 Melanocytic nevi, unspecified: Secondary | ICD-10-CM

## 2021-02-18 DIAGNOSIS — L7211 Pilar cyst: Secondary | ICD-10-CM | POA: Diagnosis not present

## 2021-02-18 DIAGNOSIS — D225 Melanocytic nevi of trunk: Secondary | ICD-10-CM

## 2021-02-18 NOTE — Progress Notes (Signed)
   Follow-Up Visit   Subjective  Jennifer Mclaughlin is a 50 y.o. female who presents for the following: Cyst (Vs other of left scalp - Excise today). The patient also wonders about a mole on her face and chest/neck for which she would like to consider removal.  The following portions of the chart were reviewed this encounter and updated as appropriate:   Tobacco  Allergies  Meds  Problems  Med Hx  Surg Hx  Fam Hx      Review of Systems:  No other skin or systemic complaints except as noted in HPI or Assessment and Plan.  Objective  Well appearing patient in no apparent distress; mood and affect are within normal limits.  A focused examination was performed including scalp. Relevant physical exam findings are noted in the Assessment and Plan.  Left scalp 1.1 cm cystic papule  Left Supraclavicular Area, Left nasolabial Flesh colored papule   Assessment & Plan  Neoplasm of uncertain behavior of skin Left scalp  Skin excision  Lesion length (cm):  1.1 Lesion width (cm):  1.1 Margin per side (cm):  0 Total excision diameter (cm):  1.1 Informed consent: discussed and consent obtained   Timeout: patient name, date of birth, surgical site, and procedure verified   Procedure prep:  Patient was prepped and draped in usual sterile fashion Prep type:  Isopropyl alcohol and povidone-iodine Anesthesia: the lesion was anesthetized in a standard fashion   Anesthetic:  1% lidocaine w/ epinephrine 1-100,000 buffered w/ 8.4% NaHCO3 Instrument used: #15 blade   Hemostasis achieved with: pressure   Hemostasis achieved with comment:  Electrocautery Outcome: patient tolerated procedure well with no complications   Post-procedure details: sterile dressing applied and wound care instructions given   Dressing type: bandage and pressure dressing (mupirocin)    Skin repair Complexity:  Simple Informed consent: discussed and consent obtained   Fine/surface layer approximation (top  stitches):  Suture size:  3-0 Suture type: nylon   Suture type comment:  Nylon Suture removal (days):  7 Hemostasis achieved with: suture, pressure and electrodesiccation Outcome: patient tolerated procedure well with no complications   Post-procedure details: sterile dressing applied and wound care instructions given   Dressing type: bandage and pressure dressing (mupirocin)    Specimen 1 - Surgical pathology Differential Diagnosis: Cyst vs other Check Margins: No  Nevus (2) Left nasolabial; Left Supraclavicular Area  May plan shave removal/biopsy at post op.  Return in about 1 week (around 02/25/2021) for suture removal and shave removal.  I, Ashok Cordia, CMA, am acting as scribe for Sarina Ser, MD . Documentation: I have reviewed the above documentation for accuracy and completeness, and I agree with the above.  Sarina Ser, MD

## 2021-02-18 NOTE — Patient Instructions (Signed)
Wound Care Instructions  Cleanse wound gently with soap and water once a day then pat dry with clean gauze. Apply a thing coat of Petrolatum (petroleum jelly, "Vaseline") over the wound (unless you have an allergy to this). We recommend that you use a new, sterile tube of Vaseline. Do not pick or remove scabs. Do not remove the yellow or white "healing tissue" from the base of the wound.  Cover the wound with fresh, clean, nonstick gauze and secure with paper tape. You may use Band-Aids in place of gauze and tape if the would is small enough, but would recommend trimming much of the tape off as there is often too much. Sometimes Band-Aids can irritate the skin.  You should call the office for your biopsy report after 1 week if you have not already been contacted.  If you experience any problems, such as abnormal amounts of bleeding, swelling, significant bruising, significant pain, or evidence of infection, please call the office immediately.  FOR ADULT SURGERY PATIENTS: If you need something for pain relief you may take 1 extra strength Tylenol (acetaminophen) AND 2 Ibuprofen (200mg each) together every 4 hours as needed for pain. (do not take these if you are allergic to them or if you have a reason you should not take them.) Typically, you may only need pain medication for 1 to 3 days.   If you have any questions or concerns for your doctor, please call our main line at [phone] and press option 4 to reach your doctor's medical assistant. If no one answers, please leave a voicemail as directed and we will return your call as soon as possible. Messages left after 4 pm will be answered the following business day.   You may also send us a message via MyChart. We typically respond to MyChart messages within 1-2 business days.  For prescription refills, please ask your pharmacy to contact our office. Our fax number is [phone].  If you have an urgent issue when the clinic is closed that  cannot wait until the next business day, you can page your doctor at the number below.    Please note that while we do our best to be available for urgent issues outside of office hours, we are not available 24/7.   If you have an urgent issue and are unable to reach us, you may choose to seek medical care at your doctor's office, retail clinic, urgent care center, or emergency room.  If you have a medical emergency, please immediately call 911 or go to the emergency department.  Pager Numbers  - Dr. Kowalski: [phone]  - Dr. Moye: [phone]  - Dr. Stewart: [phone]  In the event of inclement weather, please call our main line at [phone] for an update on the status of any delays or closures.  Dermatology Medication Tips: Please keep the boxes that topical medications come in in order to help keep track of the instructions about where and how to use these. Pharmacies typically print the medication instructions only on the boxes and not directly on the medication tubes.   If your medication is too expensive, please contact our office at [phone] option 4 or send us a message through MyChart.   We are unable to tell what your co-pay for medications will be in advance as this is different depending on your insurance coverage. However, we may be able to find a substitute medication at lower cost or fill out paperwork to get insurance to cover a needed   medication.   If a prior authorization is required to get your medication covered by your insurance company, please allow us 1-2 business days to complete this process.  Drug prices often vary depending on where the prescription is filled and some pharmacies may offer cheaper prices.  The website www.goodrx.com contains coupons for medications through different pharmacies. The prices here do not account for what the cost may be with help from insurance (it may be cheaper with your insurance), but the website can give you the  price if you did not use any insurance.  - You can print the associated coupon and take it with your prescription to the pharmacy.  - You may also stop by our office during regular business hours and pick up a GoodRx coupon card.  - If you need your prescription sent electronically to a different pharmacy, notify our office through [place] MyChart or by phone at [phone] option 4.   

## 2021-02-19 ENCOUNTER — Telehealth: Payer: Self-pay

## 2021-02-19 NOTE — Telephone Encounter (Signed)
Tried to call patient regarding surgery. Voice mailbox is full/hd

## 2021-02-21 ENCOUNTER — Encounter: Payer: Self-pay | Admitting: Dermatology

## 2021-02-25 ENCOUNTER — Ambulatory Visit (INDEPENDENT_AMBULATORY_CARE_PROVIDER_SITE_OTHER): Payer: 59 | Admitting: Dermatology

## 2021-02-25 ENCOUNTER — Other Ambulatory Visit: Payer: Self-pay

## 2021-02-25 DIAGNOSIS — L650 Telogen effluvium: Secondary | ICD-10-CM

## 2021-02-25 DIAGNOSIS — L720 Epidermal cyst: Secondary | ICD-10-CM

## 2021-02-25 DIAGNOSIS — D229 Melanocytic nevi, unspecified: Secondary | ICD-10-CM

## 2021-02-25 DIAGNOSIS — D2239 Melanocytic nevi of other parts of face: Secondary | ICD-10-CM

## 2021-02-25 NOTE — Patient Instructions (Signed)

## 2021-02-25 NOTE — Progress Notes (Signed)
   Follow-Up Visit   Subjective  Jennifer Mclaughlin is a 50 y.o. female who presents for the following: suture removal (Post op - s/p excision of the L scalp, pathology proven benign cyst, patient is here today for suture removal). Patient c/o hair loss and thinning. She was dx with thyroid issues a few months ago and thinks she may be peri-menopausal.   The following portions of the chart were reviewed this encounter and updated as appropriate:   Tobacco  Allergies  Meds  Problems  Med Hx  Surg Hx  Fam Hx      Review of Systems:  No other skin or systemic complaints except as noted in HPI or Assessment and Plan.  Objective  Well appearing patient in no apparent distress; mood and affect are within normal limits.  A focused examination was performed including the scalp and face. Relevant physical exam findings are noted in the Assessment and Plan.  L nasolabial Flesh colored papule.  L scalp Healing excision site.  Scalp Diffuse thinning of hair.   Assessment & Plan  Nevus L nasolabial  Benign-appearing.  Observation.  Call clinic for new or changing lesions.  Recommend daily use of broad spectrum spf 30+ sunscreen to sun-exposed areas.   Patient declines shave removal at this time.  Epidermal inclusion cyst L scalp  Encounter for Removal of Sutures - Incision site at the L scalp is clean, dry and intact - Wound cleansed, sutures removed, wound cleansed and steri strips applied.  - Discussed pathology results showing a benign cyst.  - Patient advised to keep steri-strips dry until they fall off. - Scars remodel for a full year. - Once steri-strips fall off, patient can apply over-the-counter silicone scar cream each night to help with scar remodeling if desired. - Patient advised to call with any concerns or if they notice any new or changing lesions.   Telogen effluvium Scalp  Discussed possible causes of stress, hormone changes, and illness. Likely related to  recent thyroid issues and hormonal changes. Patient to f/u with her PCP for further testing.  Return if symptoms worsen or fail to improve.  Luther Redo, CMA, am acting as scribe for Sarina Ser, MD .  Documentation: I have reviewed the above documentation for accuracy and completeness, and I agree with the above.  Sarina Ser, MD

## 2021-02-26 ENCOUNTER — Other Ambulatory Visit: Payer: Self-pay

## 2021-02-26 ENCOUNTER — Ambulatory Visit
Admission: RE | Admit: 2021-02-26 | Discharge: 2021-02-26 | Disposition: A | Discharge status: Home / Self Care | Payer: 59 | Source: Ambulatory Visit | Attending: Family Medicine | Admitting: Family Medicine

## 2021-02-26 DIAGNOSIS — R413 Other amnesia: Secondary | ICD-10-CM | POA: Diagnosis not present

## 2021-02-26 MED FILL — Omeprazole Cap Delayed Release 20 MG: ORAL | 30 days supply | Qty: 30 | Fill #1 | Status: AC

## 2021-02-27 ENCOUNTER — Other Ambulatory Visit: Payer: Self-pay

## 2021-03-04 ENCOUNTER — Other Ambulatory Visit: Payer: Self-pay

## 2021-03-04 MED FILL — Alprazolam Tab 0.5 MG: ORAL | 30 days supply | Qty: 30 | Fill #2 | Status: AC

## 2021-03-05 ENCOUNTER — Other Ambulatory Visit: Payer: Self-pay

## 2021-03-05 MED ORDER — "SYRINGE 25G X 1"" 3 ML MISC": 0 refills | Status: DC

## 2021-03-05 MED ORDER — CYANOCOBALAMIN 1000 MCG/ML IJ SOLN
INTRAMUSCULAR | 0 refills | Status: AC
  Filled 2021-03-05: qty 1, 14d supply, fill #0

## 2021-03-06 ENCOUNTER — Other Ambulatory Visit: Payer: Self-pay

## 2021-03-08 ENCOUNTER — Encounter: Payer: Self-pay | Admitting: Dermatology

## 2021-03-11 ENCOUNTER — Other Ambulatory Visit: Payer: Self-pay

## 2021-03-11 MED ORDER — CYANOCOBALAMIN 1000 MCG/ML IJ SOLN
INTRAMUSCULAR | 5 refills | Status: DC
  Filled 2021-03-11 – 2021-03-24 (×2): qty 2, 28d supply, fill #0
  Filled 2021-04-30: qty 2, 28d supply, fill #1
  Filled 2021-05-28: qty 2, 28d supply, fill #2
  Filled 2021-06-30: qty 2, 28d supply, fill #3
  Filled 2021-07-18: qty 2, 28d supply, fill #4
  Filled 2022-02-09: qty 2, 28d supply, fill #5

## 2021-03-11 MED ORDER — "SYRINGE 25G X 1"" 3 ML MISC": 0 refills | Status: DC

## 2021-03-14 ENCOUNTER — Other Ambulatory Visit: Payer: Self-pay

## 2021-03-17 ENCOUNTER — Other Ambulatory Visit: Payer: Self-pay

## 2021-03-24 ENCOUNTER — Other Ambulatory Visit: Payer: Self-pay

## 2021-03-26 ENCOUNTER — Other Ambulatory Visit: Payer: Self-pay

## 2021-03-26 MED ORDER — PREDNISONE 20 MG PO TABS
ORAL_TABLET | ORAL | 0 refills | Status: DC
  Filled 2021-03-26: qty 5, 5d supply, fill #0

## 2021-03-26 MED ORDER — AZITHROMYCIN 250 MG PO TABS
ORAL_TABLET | ORAL | 0 refills | Status: DC
  Filled 2021-03-26: qty 6, 5d supply, fill #0

## 2021-04-07 ENCOUNTER — Other Ambulatory Visit: Payer: Self-pay

## 2021-04-07 MED FILL — Omeprazole Cap Delayed Release 20 MG: ORAL | 30 days supply | Qty: 30 | Fill #2 | Status: AC

## 2021-04-08 ENCOUNTER — Other Ambulatory Visit: Payer: Self-pay | Admitting: Obstetrics and Gynecology

## 2021-04-08 DIAGNOSIS — Z1231 Encounter for screening mammogram for malignant neoplasm of breast: Secondary | ICD-10-CM

## 2021-04-11 ENCOUNTER — Other Ambulatory Visit: Payer: Self-pay

## 2021-04-11 MED FILL — Alprazolam Tab 0.5 MG: ORAL | 30 days supply | Qty: 30 | Fill #3 | Status: AC

## 2021-04-30 ENCOUNTER — Other Ambulatory Visit: Payer: Self-pay

## 2021-05-02 ENCOUNTER — Other Ambulatory Visit: Payer: Self-pay

## 2021-05-07 ENCOUNTER — Other Ambulatory Visit: Payer: Self-pay

## 2021-05-09 ENCOUNTER — Other Ambulatory Visit: Payer: Self-pay

## 2021-05-09 MED FILL — Alprazolam Tab 0.5 MG: ORAL | 30 days supply | Qty: 30 | Fill #4 | Status: AC

## 2021-05-28 ENCOUNTER — Other Ambulatory Visit: Payer: Self-pay

## 2021-05-28 MED FILL — Omeprazole Cap Delayed Release 20 MG: ORAL | 30 days supply | Qty: 30 | Fill #3 | Status: AC

## 2021-05-29 ENCOUNTER — Other Ambulatory Visit: Payer: Self-pay

## 2021-05-29 MED ORDER — ALPRAZOLAM 0.5 MG PO TABS
0.5000 mg | ORAL_TABLET | Freq: Every evening | ORAL | 5 refills | Status: DC | PRN
  Filled 2021-05-29 – 2021-06-05 (×2): qty 30, 30d supply, fill #0
  Filled 2021-07-04: qty 30, 30d supply, fill #1
  Filled 2021-07-18 – 2021-08-11 (×2): qty 30, 30d supply, fill #2
  Filled 2021-09-11: qty 30, 30d supply, fill #3
  Filled 2021-10-14: qty 30, 30d supply, fill #4
  Filled 2021-11-10: qty 30, 30d supply, fill #5

## 2021-06-05 ENCOUNTER — Other Ambulatory Visit: Payer: Self-pay

## 2021-06-06 ENCOUNTER — Other Ambulatory Visit: Payer: Self-pay

## 2021-06-16 ENCOUNTER — Other Ambulatory Visit: Payer: Self-pay

## 2021-06-16 ENCOUNTER — Emergency Department
Admission: EM | Admit: 2021-06-16 | Discharge: 2021-06-16 | Disposition: A | Discharge status: Home / Self Care | Payer: 59 | Attending: Emergency Medicine | Admitting: Emergency Medicine

## 2021-06-16 ENCOUNTER — Emergency Department: Payer: 59

## 2021-06-16 DIAGNOSIS — R109 Unspecified abdominal pain: Secondary | ICD-10-CM | POA: Insufficient documentation

## 2021-06-16 DIAGNOSIS — Z87891 Personal history of nicotine dependence: Secondary | ICD-10-CM | POA: Diagnosis not present

## 2021-06-16 DIAGNOSIS — R110 Nausea: Secondary | ICD-10-CM | POA: Insufficient documentation

## 2021-06-16 DIAGNOSIS — R350 Frequency of micturition: Secondary | ICD-10-CM | POA: Insufficient documentation

## 2021-06-16 LAB — URINALYSIS, COMPLETE (UACMP) WITH MICROSCOPIC
Bilirubin Urine: NEGATIVE
Glucose, UA: NEGATIVE mg/dL
Hgb urine dipstick: NEGATIVE
Ketones, ur: NEGATIVE mg/dL
Leukocytes,Ua: NEGATIVE
Nitrite: NEGATIVE
Protein, ur: NEGATIVE mg/dL
Specific Gravity, Urine: 1.009 (ref 1.005–1.030)
pH: 6 (ref 5.0–8.0)

## 2021-06-16 LAB — CBC WITH DIFFERENTIAL/PLATELET
Abs Immature Granulocytes: 0.02 10*3/uL (ref 0.00–0.07)
Basophils Absolute: 0 10*3/uL (ref 0.0–0.1)
Basophils Relative: 1 %
Eosinophils Absolute: 0.2 10*3/uL (ref 0.0–0.5)
Eosinophils Relative: 2 %
HCT: 43 % (ref 36.0–46.0)
Hemoglobin: 15.2 g/dL — ABNORMAL HIGH (ref 12.0–15.0)
Immature Granulocytes: 0 %
Lymphocytes Relative: 43 %
Lymphs Abs: 3.8 10*3/uL (ref 0.7–4.0)
MCH: 31.4 pg (ref 26.0–34.0)
MCHC: 35.3 g/dL (ref 30.0–36.0)
MCV: 88.8 fL (ref 80.0–100.0)
Monocytes Absolute: 0.4 10*3/uL (ref 0.1–1.0)
Monocytes Relative: 5 %
Neutro Abs: 4.4 10*3/uL (ref 1.7–7.7)
Neutrophils Relative %: 49 %
Platelets: 273 10*3/uL (ref 150–400)
RBC: 4.84 MIL/uL (ref 3.87–5.11)
RDW: 12.5 % (ref 11.5–15.5)
WBC: 8.8 10*3/uL (ref 4.0–10.5)
nRBC: 0 % (ref 0.0–0.2)

## 2021-06-16 LAB — BASIC METABOLIC PANEL
Anion gap: 8 (ref 5–15)
BUN: 16 mg/dL (ref 6–20)
CO2: 25 mmol/L (ref 22–32)
Calcium: 9.9 mg/dL (ref 8.9–10.3)
Chloride: 106 mmol/L (ref 98–111)
Creatinine, Ser: 0.78 mg/dL (ref 0.44–1.00)
GFR, Estimated: >60 mL/min (ref >60)
Glucose, Bld: 106 mg/dL — ABNORMAL HIGH (ref 70–99)
Potassium: 4 mmol/L (ref 3.5–5.1)
Sodium: 139 mmol/L (ref 135–145)

## 2021-06-16 LAB — HEPATIC FUNCTION PANEL
ALT: 19 U/L (ref 0–44)
AST: 39 U/L (ref 15–41)
Albumin: 4.6 g/dL (ref 3.5–5.0)
Alkaline Phosphatase: 51 U/L (ref 38–126)
Bilirubin, Direct: <0.1 mg/dL (ref 0.0–0.2)
Total Bilirubin: 0.9 mg/dL (ref 0.3–1.2)
Total Protein: 7.5 g/dL (ref 6.5–8.1)

## 2021-06-16 LAB — LIPASE, BLOOD: Lipase: 33 U/L (ref 11–51)

## 2021-06-16 MED ORDER — OXYCODONE-ACETAMINOPHEN 5-325 MG PO TABS
1.0000 | ORAL_TABLET | Freq: Once | ORAL | Status: AC
  Administered 2021-06-16: 1 via ORAL
  Filled 2021-06-16: qty 1

## 2021-06-16 NOTE — ED Triage Notes (Signed)
Pt states that she has a hx of a kidney stone in the past, pt states that she has been hurting for a few days and its getting worse, pt is having right sided flank pain

## 2021-06-16 NOTE — ED Provider Notes (Signed)
Emergency Medicine Provider Triage Evaluation Note  Jennifer Mclaughlin , a 50 y.o. female  was evaluated in triage.  Pt complains of right side flank pain x 2-3 days.  Review of Systems  Positive: Flank pain Negative: Fever  Physical Exam  BP (!) 170/106 (BP Location: Right Arm)   Pulse 87   Temp 98.5 F (36.9 C) (Oral)   Resp 20   Ht 5\' 3"  (1.6 m)   Wt 73.9 kg   SpO2 97%   BMI 28.87 kg/m  Gen:   Awake, no distress   Resp:  Normal effort  MSK:   Moves extremities without difficulty  Other:    Medical Decision Making  Medically screening exam initiated at 12:41 PM.  Appropriate orders placed.  Jennifer Mclaughlin was informed that the remainder of the evaluation will be completed by another provider, this initial triage assessment does not replace that evaluation, and the importance of remaining in the ED until their evaluation is complete.   Jennifer Dike, FNP 06/16/21 1242    Jennifer Hay, MD 06/16/21 [phone]

## 2021-06-16 NOTE — ED Provider Notes (Signed)
[facility]  ____________________________________________   Event Date/Time   First MD Initiated Contact with Patient 06/16/21 1330     (approximate)  I have reviewed the triage vital signs and the nursing notes.   HISTORY  Chief Complaint Flank Pain    HPI Jennifer Mclaughlin is a 50 y.o. female with past medical history of liver hemangioma who presents with right-sided flank pain.  Symptoms started about 3 days ago.  Initially was intermittent now constant.  Pain is located in the right flank and radiates around to the right upper quadrant.  She has some associated nausea but no vomiting.  No fevers chills.  Has had urinary frequency but no dysuria or hematuria.  Has not been taking anything for pain.  Denies chest pain.         Past Medical History:  Diagnosis Date   Anemia     There are no problems to display for this patient.   Past Surgical History:  Procedure Laterality Date   CHOLECYSTECTOMY     NASAL SINUS SURGERY     TONSILLECTOMY     TUBAL LIGATION      Prior to Admission medications   Medication Sig Start Date End Date Taking? Authorizing Provider  ALPRAZolam Duanne Moron) 0.5 MG tablet  01/06/17   [provider]  ALPRAZolam (XANAX) 0.5 MG tablet TAKE 1 TABLET BY MOUTH NIGHTLY AS NEEDED FOR SLEEP 05/28/21     cetirizine (ZYRTEC) 10 MG tablet Take 10 mg by mouth daily.    [provider]  cyanocobalamin (,VITAMIN B-12,) 1000 MCG/ML injection Inject into the muscle. Patient not taking: Reported on 02/25/2021 10/16/15   [provider]  cyanocobalamin (,VITAMIN B-12,) 1000 MCG/ML injection Inject 1 mL (1,000 mcg total) into the muscle every 14 (fourteen) days 03/05/21     cyanocobalamin (,VITAMIN B-12,) 1000 MCG/ML injection Inject 1 mL (1,000 mcg total) into the muscle every 14 (fourteen) days 03/11/21     fluticasone (FLONASE) 50 MCG/ACT nasal spray  02/05/17   [provider]  lactulose (CHRONULAC) 10 GM/15ML  solution Take 30 mLs by mouth 2 (two) times daily as needed for Constipation for up to 10 days 12/10/20     melatonin 5 MG TABS Take by mouth.    [provider]  omeprazole (PRILOSEC) 20 MG capsule TAKE 1 CAPSULE (20 MG TOTAL) BY MOUTH ONCE DAILY Patient not taking: Reported on 02/25/2021 07/19/20 07/19/21  Idelle Crouch, MD  omeprazole (PRILOSEC) 40 MG capsule Take by mouth. 05/21/15 05/20/16  [provider]  predniSONE (DELTASONE) 20 MG tablet Take 1 tablet (20 mg total) by mouth once daily for 5 days 03/26/21     predniSONE (DELTASONE) 50 MG tablet Take 1 tablet (50 mg total) by mouth daily with breakfast. Patient not taking: Reported on 02/25/2021 12/02/20   Lavonia Drafts, MD  Syringe/Needle, Disp, (SYRINGE 3CC/25GX1") 25G X 1" 3 ML MISC  12/11/15   [provider]  Syringe/Needle, Disp, (SYRINGE 3CC/25GX1") 25G X 1" 3 ML MISC Use 1 each every 14 (fourteen) days 03/05/21     Syringe/Needle, Disp, (SYRINGE 3CC/25GX1") 25G X 1" 3 ML MISC Use 1 each every 14 (fourteen) days 03/11/21       Allergies Biaxin [clarithromycin], Ceftin [cefuroxime axetil], Penicillins, Septra [sulfamethoxazole-trimethoprim], Sulfa antibiotics, Amoxicillin, Dicyclomine, Hydrochlorothiazide, Linaclotide, Nebivolol, and Telmisartan  Family History  Problem Relation Age of Onset   Thyroid cancer Father    Breast cancer Neg Hx     Social History  Social History   Tobacco Use   Smoking status: Former   Smokeless tobacco: Never  Substance Use Topics   Alcohol use: Yes    Alcohol/week: 0.0 standard drinks    Comment: occas   Drug use: No    Review of Systems   Review of Systems  Constitutional:  Negative for chills and fever.  Respiratory:  Negative for shortness of breath.   Cardiovascular:  Negative for chest pain.  Gastrointestinal:  Positive for abdominal pain and nausea. Negative for constipation, diarrhea and vomiting.  Genitourinary:  Positive for flank pain and frequency.  Negative for dysuria and hematuria.  All other systems reviewed and are negative.  Physical Exam Updated Vital Signs BP (!) 155/106 (BP Location: Right Arm)   Pulse 69   Temp 98.5 F (36.9 C) (Oral)   Resp 20   Ht 5\' 3"  (1.6 m)   Wt 73.9 kg   SpO2 94%   BMI 28.87 kg/m   Physical Exam Vitals and nursing note reviewed.  Constitutional:      General: She is not in acute distress.    Appearance: Normal appearance.  HENT:     Head: Normocephalic and atraumatic.  Eyes:     General: No scleral icterus.    Conjunctiva/sclera: Conjunctivae normal.  Pulmonary:     Effort: Pulmonary effort is normal. No respiratory distress.     Breath sounds: No stridor.  Abdominal:     General: Abdomen is flat.     Palpations: Abdomen is soft.     Comments: Tenderness to palpation of the right upper quadrant, no guarding Right CVA tenderness  Musculoskeletal:        General: No deformity or signs of injury.     Cervical back: Normal range of motion.  Skin:    General: Skin is dry.     Coloration: Skin is not jaundiced or pale.  Neurological:     General: No focal deficit present.     Mental Status: She is alert and oriented to person, place, and time. Mental status is at baseline.  Psychiatric:        Mood and Affect: Mood normal.        Behavior: Behavior normal.     LABS (all labs ordered are listed, but only abnormal results are displayed)  Labs Reviewed  BASIC METABOLIC PANEL - Abnormal; Notable for the following components:      Result Value   Glucose, Bld 106 (*)    All other components within normal limits  CBC WITH DIFFERENTIAL/PLATELET - Abnormal; Notable for the following components:   Hemoglobin 15.2 (*)    All other components within normal limits  URINALYSIS, COMPLETE (UACMP) WITH MICROSCOPIC - Abnormal; Notable for the following components:   Color, Urine YELLOW (*)    APPearance CLEAR (*)    Bacteria, UA RARE (*)    All other components within normal limits   HEPATIC FUNCTION PANEL  LIPASE, BLOOD   ____________________________________________  EKG  N/a ____________________________________________  RADIOLOGY Almeta Monas, personally viewed and evaluated these images (plain radiographs) as part of my medical decision making, as well as reviewing the written report by the radiologist.  ED MD interpretation: I reviewed the CT renal study which does not show any acute intra abdominal process    ____________________________________________   PROCEDURES  Procedure(s) performed (including Critical Care):  Procedures   ____________________________________________   INITIAL IMPRESSION / ASSESSMENT AND PLAN / ED COURSE     Patient is a  50 year old female presents with right flank pain x3 days.  Has some urinary frequency and nausea but no other associated symptoms.  Her urine is clean, no RBCs or WBCs.  On exam she is very well-appearing and comfortable.  She does have some right CVA tenderness as well as right upper quadrant tenderness to palpation but no guarding.  She is status postcholecystectomy.  Does have known liver hemangioma which are redemonstrated today on CT renal study.  CT renal does not show any evidence of stones or other acute process that would explain her pain.  Will check LFTs and lipase.  LFTs and lipase are within normal limits.  No clear etiology of her pain at this point.  Suspect possible musculoskeletal etiology.  Explained results with patient.  We discussed return precautions.      ____________________________________________   FINAL CLINICAL IMPRESSION(S) / ED DIAGNOSES  Final diagnoses:  Flank pain     ED Discharge Orders     None        Note:  This document was prepared using Dragon voice recognition software and may include unintentional dictation errors.    Rada Hay, MD 06/16/21 [phone]

## 2021-06-16 NOTE — ED Notes (Signed)
See triage note  presents with right flank pain which started on Friday  pain has gotten worse  no fever or n/v

## 2021-06-16 NOTE — Discharge Instructions (Signed)
Your CT scan did not show any kidney stone.  Your blood work was also reassuring.  The hemangioma in your liver looks similar to prior.  We do not know exactly what is causing your pain.  It is possible that it is related to muscle spasm.  If you develop fevers, nausea vomiting or any new symptoms that are concerning to you, please return to the emergency department.  Otherwise you can continue to take ibuprofen and follow-up with your primary care provider.

## 2021-06-30 ENCOUNTER — Other Ambulatory Visit: Payer: Self-pay

## 2021-06-30 MED FILL — Omeprazole Cap Delayed Release 20 MG: ORAL | 30 days supply | Qty: 30 | Fill #4 | Status: AC

## 2021-07-01 ENCOUNTER — Other Ambulatory Visit: Payer: Self-pay

## 2021-07-04 ENCOUNTER — Other Ambulatory Visit: Payer: Self-pay

## 2021-07-09 ENCOUNTER — Ambulatory Visit
Admission: RE | Admit: 2021-07-09 | Discharge: 2021-07-09 | Disposition: A | Discharge status: Home / Self Care | Payer: 59 | Source: Ambulatory Visit | Attending: Obstetrics and Gynecology | Admitting: Obstetrics and Gynecology

## 2021-07-09 ENCOUNTER — Other Ambulatory Visit: Payer: Self-pay

## 2021-07-09 DIAGNOSIS — Z1231 Encounter for screening mammogram for malignant neoplasm of breast: Secondary | ICD-10-CM | POA: Diagnosis present

## 2021-07-18 ENCOUNTER — Other Ambulatory Visit: Payer: Self-pay

## 2021-07-21 ENCOUNTER — Other Ambulatory Visit: Payer: Self-pay

## 2021-07-23 ENCOUNTER — Other Ambulatory Visit: Payer: Self-pay

## 2021-07-23 MED ORDER — CYANOCOBALAMIN 1000 MCG/ML IJ SOLN
INTRAMUSCULAR | 5 refills | Status: DC
  Filled 2021-07-23: qty 6, 84d supply, fill #0
  Filled 2021-07-31: qty 12, 180d supply, fill #0
  Filled 2021-08-11: qty 6, 84d supply, fill #0
  Filled 2021-11-17: qty 2, 28d supply, fill #1
  Filled 2021-12-04 – 2021-12-09 (×2): qty 2, 28d supply, fill #2
  Filled 2022-01-06: qty 2, 28d supply, fill #3

## 2021-07-28 ENCOUNTER — Other Ambulatory Visit: Payer: Self-pay

## 2021-07-31 ENCOUNTER — Other Ambulatory Visit: Payer: Self-pay

## 2021-07-31 MED ORDER — CYANOCOBALAMIN 1000 MCG/ML IJ SOLN: INTRAMUSCULAR | 5 refills | Status: DC

## 2021-07-31 MED ORDER — OMEPRAZOLE 20 MG PO CPDR
DELAYED_RELEASE_CAPSULE | ORAL | 1 refills | Status: DC
  Filled 2021-07-31: qty 30, 30d supply, fill #0
  Filled 2021-09-02: qty 30, 30d supply, fill #1

## 2021-08-07 ENCOUNTER — Other Ambulatory Visit: Payer: Self-pay

## 2021-08-11 ENCOUNTER — Other Ambulatory Visit: Payer: Self-pay

## 2021-08-25 ENCOUNTER — Emergency Department
Admission: EM | Admit: 2021-08-25 | Discharge: 2021-08-25 | Disposition: A | Discharge status: Home / Self Care | Payer: 59 | Attending: Emergency Medicine | Admitting: Emergency Medicine

## 2021-08-25 ENCOUNTER — Other Ambulatory Visit: Payer: Self-pay

## 2021-08-25 ENCOUNTER — Emergency Department: Payer: 59

## 2021-08-25 DIAGNOSIS — Z87891 Personal history of nicotine dependence: Secondary | ICD-10-CM | POA: Diagnosis not present

## 2021-08-25 DIAGNOSIS — M7551 Bursitis of right shoulder: Secondary | ICD-10-CM | POA: Insufficient documentation

## 2021-08-25 DIAGNOSIS — M25511 Pain in right shoulder: Secondary | ICD-10-CM | POA: Diagnosis present

## 2021-08-25 MED ORDER — OXYCODONE-ACETAMINOPHEN 5-325 MG PO TABS
1.0000 | ORAL_TABLET | ORAL | 0 refills | Status: DC | PRN
  Filled 2021-08-25: qty 12, 2d supply, fill #0

## 2021-08-25 MED ORDER — OXYCODONE-ACETAMINOPHEN 5-325 MG PO TABS
1.0000 | ORAL_TABLET | ORAL | Status: DC | PRN
  Administered 2021-08-25: 07:00:00 1 via ORAL
  Filled 2021-08-25: qty 1

## 2021-08-25 MED ORDER — OXYCODONE-ACETAMINOPHEN 5-325 MG PO TABS: 1.0000 | ORAL_TABLET | ORAL | 0 refills | Status: DC | PRN

## 2021-08-25 MED ORDER — CYCLOBENZAPRINE HCL 10 MG PO TABS: 10.0000 mg | ORAL_TABLET | Freq: Three times a day (TID) | ORAL | 0 refills | Status: DC | PRN

## 2021-08-25 MED ORDER — METHYLPREDNISOLONE 4 MG PO TBPK
ORAL_TABLET | ORAL | 0 refills | Status: DC
  Filled 2021-08-25: qty 21, fill #0

## 2021-08-25 MED ORDER — CYCLOBENZAPRINE HCL 10 MG PO TABS
10.0000 mg | ORAL_TABLET | Freq: Three times a day (TID) | ORAL | 0 refills | Status: DC | PRN
  Filled 2021-08-25: qty 30, 10d supply, fill #0

## 2021-08-25 MED ORDER — METHYLPREDNISOLONE 4 MG PO TBPK: ORAL_TABLET | ORAL | 0 refills | Status: DC

## 2021-08-25 NOTE — Discharge Instructions (Signed)
Follow-up with your regular doctor if not improving in 3 days.  Return emergency department worsening. Follow-up with orthopedics.  [facility] as a walk-in clinic. Apply ice to the right shoulder Take medications as prescribed

## 2021-08-25 NOTE — ED Notes (Signed)
Pt verbalizes understanding of d/c instructions, medications and follow up 

## 2021-08-25 NOTE — ED Provider Notes (Signed)
[facility] Emergency Department Provider Note  ____________________________________________   Event Date/Time   First MD Initiated Contact with Patient 08/25/21 [phone]     (approximate)  I have reviewed the triage vital signs and the nursing notes.   HISTORY  Chief Complaint Shoulder Pain    HPI Jennifer Mclaughlin is a 50 y.o. female presents emergency department complaining of right shoulder pain.  Patient states she played darts the other night and started having pain in the right arm.  Awoke this morning and is unable to raise her arm over her head.  Severe pain at the shoulder joint.  No numbness or tingling.  No redness or swelling.  Patient took a Flexeril from 2018 without any relief.  Was given pain medication here in the ED.  Having slight relief with pain medication  Past Medical History:  Diagnosis Date   Anemia     There are no problems to display for this patient.   Past Surgical History:  Procedure Laterality Date   CHOLECYSTECTOMY     NASAL SINUS SURGERY     TONSILLECTOMY     TUBAL LIGATION      Prior to Admission medications   Medication Sig Start Date End Date Taking? Authorizing Provider  cyclobenzaprine (FLEXERIL) 10 MG tablet Take 1 tablet (10 mg total) by mouth 3 (three) times daily as needed. 08/25/21  Yes Sonny Poth, Linden Dolin, PA-C  methylPREDNISolone (MEDROL DOSEPAK) 4 MG TBPK tablet Take 6 pills on day one then decrease by 1 pill each day 08/25/21  Yes Idabelle Mcpeters, Linden Dolin, PA-C  oxyCODONE-acetaminophen (PERCOCET) 5-325 MG tablet Take 1 tablet by mouth every 4 (four) hours as needed for severe pain. 08/25/21 08/25/22 Yes Leanore Biggers, Linden Dolin, PA-C  ALPRAZolam Duanne Moron) 0.5 MG tablet  01/06/17   [provider]  ALPRAZolam Duanne Moron) 0.5 MG tablet TAKE 1 TABLET BY MOUTH NIGHTLY AS NEEDED FOR SLEEP 05/28/21     cetirizine (ZYRTEC) 10 MG tablet Take 10 mg by mouth daily.    [provider]  cyanocobalamin (,VITAMIN B-12,) 1000  MCG/ML injection Inject into the muscle. Patient not taking: Reported on 02/25/2021 10/16/15   [provider]  cyanocobalamin (,VITAMIN B-12,) 1000 MCG/ML injection Inject 1 mL (1,000 mcg total) into the muscle every 14 (fourteen) days 03/05/21     cyanocobalamin (,VITAMIN B-12,) 1000 MCG/ML injection Inject 1 mL (1,000 mcg total) into the muscle every 14 (fourteen) days 03/11/21     cyanocobalamin (,VITAMIN B-12,) 1000 MCG/ML injection Inject 1 mL (1,000 mcg total) into the muscle every 14 (fourteen) days 07/23/21     cyanocobalamin (,VITAMIN B-12,) 1000 MCG/ML injection Inject 1 mL (1,000 mcg total) into the muscle every 14 (fourteen) days 07/31/21     fluticasone (FLONASE) 50 MCG/ACT nasal spray  02/05/17   [provider]  lactulose (CHRONULAC) 10 GM/15ML solution Take 30 mLs by mouth 2 (two) times daily as needed for Constipation for up to 10 days 12/10/20     melatonin 5 MG TABS Take by mouth.    [provider]  omeprazole (PRILOSEC) 20 MG capsule TAKE 1 CAPSULE (20 MG TOTAL) BY MOUTH ONCE DAILY 07/31/21     omeprazole (PRILOSEC) 40 MG capsule Take by mouth. 05/21/15 05/20/16  [provider]  predniSONE (DELTASONE) 20 MG tablet Take 1 tablet (20 mg total) by mouth once daily for 5 days 03/26/21     predniSONE (DELTASONE) 50 MG tablet Take 1 tablet (50 mg total) by mouth daily with  breakfast. Patient not taking: Reported on 02/25/2021 12/02/20   Lavonia Drafts, MD  Syringe/Needle, Disp, (SYRINGE 3CC/25GX1") 25G X 1" 3 ML MISC  12/11/15   [provider]  Syringe/Needle, Disp, (SYRINGE 3CC/25GX1") 25G X 1" 3 ML MISC Use 1 each every 14 (fourteen) days 03/05/21     Syringe/Needle, Disp, (SYRINGE 3CC/25GX1") 25G X 1" 3 ML MISC Use 1 each every 14 (fourteen) days 03/11/21       Allergies Biaxin [clarithromycin], Ceftin [cefuroxime axetil], Penicillins, Septra [sulfamethoxazole-trimethoprim], Sulfa antibiotics, Amoxicillin, Dicyclomine, Hydrochlorothiazide,  Linaclotide, Nebivolol, and Telmisartan  Family History  Problem Relation Age of Onset   Thyroid cancer Father    Breast cancer Neg Hx     Social History Social History   Tobacco Use   Smoking status: Former   Smokeless tobacco: Never  Substance Use Topics   Alcohol use: Yes    Alcohol/week: 0.0 standard drinks    Comment: occas   Drug use: No    Review of Systems  Constitutional: No fever/chills Eyes: No visual changes. ENT: No sore throat. Respiratory: Denies cough Cardiovascular: Denies chest pain Gastrointestinal: Denies abdominal pain Genitourinary: Negative for dysuria. Musculoskeletal: Negative for back pain.  Positive right shoulder pain Skin: Negative for rash. Psychiatric: no mood changes,     ____________________________________________   PHYSICAL EXAM:  VITAL SIGNS: ED Triage Vitals  Enc Vitals Group     BP 08/25/21 0645 (!) 122/92     Pulse Rate 08/25/21 0645 86     Resp 08/25/21 0645 18     Temp 08/25/21 0645 97.6 F (36.4 C)     Temp Source 08/25/21 0645 Oral     SpO2 08/25/21 0645 96 %     Weight 08/25/21 0637 163 lb (73.9 kg)     Height 08/25/21 0637 5\' 3"  (1.6 m)     Head Circumference --      Peak Flow --      Pain Score 08/25/21 0637 10     Pain Loc --      Pain Edu? --      Excl. in [place]? --     Constitutional: Alert and oriented. Well appearing and in no acute distress. Eyes: Conjunctivae are normal.  Head: Atraumatic. Nose: No congestion/rhinnorhea. Mouth/Throat: Mucous membranes are moist.   Neck:  supple no lymphadenopathy noted Cardiovascular: Normal rate, regular rhythm.  Respiratory: Normal respiratory effort.  No retractions,  GU: deferred Musculoskeletal: Decreased range of motion of the right shoulder, tender at the anterior aspect, nontender posteriorly, tender in the joint space, neurovascular is intact  neurologic:  Normal speech and language.  Skin:  Skin is warm, dry and intact. No rash noted. Psychiatric: Mood  and affect are normal. Speech and behavior are normal.  ____________________________________________   LABS (all labs ordered are listed, but only abnormal results are displayed)  Labs Reviewed - No data to display ____________________________________________   ____________________________________________  RADIOLOGY  X-ray of the right shoulder  ____________________________________________   PROCEDURES  Procedure(s) performed: Sling applied by nursing staff   Procedures    ____________________________________________   INITIAL IMPRESSION / ASSESSMENT AND PLAN / ED COURSE  Pertinent labs & imaging results that were available during my care of the patient were reviewed by me and considered in my medical decision making (see chart for details).   The patient is a 50 year old female presents emergency department with right shoulder pain.  See HPI.  Physical exam shows decreased range of motion. X-ray of the right shoulder reviewed  by me confirmed by radiology be negative for any bony abnormality  Do feel this is more of a bursitis/muscle strain.  Patient was placed in a sling.  She is apply ice to the right shoulder.  She given a prescription for Medrol Dosepak, Flexeril, and Percocet.  Patient is to follow-up with [facility].  Explained to her that they have a walk-in clinic if she is not having any relief with medications and they may be able to do an injection.  Patient is in agreement treatment plan.  Discharged stable condition.      Jennifer Mclaughlin was evaluated in Emergency Department on 08/25/2021 for the symptoms described in the history of present illness. She was evaluated in the context of the global COVID-19 pandemic, which necessitated consideration that the patient might be at risk for infection with the SARS-CoV-2 virus that causes COVID-19. Institutional protocols and algorithms that pertain to the evaluation of patients at risk for COVID-19 are in a  state of rapid change based on information released by regulatory bodies including the CDC and federal and state organizations. These policies and algorithms were followed during the patient's care in the ED.    As part of my medical decision making, I reviewed the following data within the [place] notes reviewed and incorporated, Old chart reviewed, Radiograph reviewed , Notes from prior ED visits, and [place] Controlled Substance Database  ____________________________________________   FINAL CLINICAL IMPRESSION(S) / ED DIAGNOSES  Final diagnoses:  Acute bursitis of right shoulder      NEW MEDICATIONS STARTED DURING THIS VISIT:  New Prescriptions   CYCLOBENZAPRINE (FLEXERIL) 10 MG TABLET    Take 1 tablet (10 mg total) by mouth 3 (three) times daily as needed.   METHYLPREDNISOLONE (MEDROL DOSEPAK) 4 MG TBPK TABLET    Take 6 pills on day one then decrease by 1 pill each day   OXYCODONE-ACETAMINOPHEN (PERCOCET) 5-325 MG TABLET    Take 1 tablet by mouth every 4 (four) hours as needed for severe pain.     Note:  This document was prepared using Dragon voice recognition software and may include unintentional dictation errors.    Versie Starks, PA-C 08/25/21 [id number]    Nena Polio, MD 08/25/21 [phone]

## 2021-08-25 NOTE — ED Triage Notes (Signed)
Pt arrived to ed via pov, ambulatory to triage. Pt denies any know injury. Reports pain starting last night, unable to raise arm up. NAD noted at this time

## 2021-08-25 NOTE — ED Notes (Signed)
Pt reports pain to right shoulder but denies obvious injuries. Pt states painful to lift right arm. Denies heavy lifting, repetitive motion, falls, etc.

## 2021-09-02 ENCOUNTER — Other Ambulatory Visit: Payer: Self-pay

## 2021-09-05 ENCOUNTER — Other Ambulatory Visit: Payer: Self-pay

## 2021-09-08 ENCOUNTER — Ambulatory Visit: Payer: 59 | Admitting: Dermatology

## 2021-09-08 ENCOUNTER — Other Ambulatory Visit: Payer: Self-pay

## 2021-09-08 DIAGNOSIS — L650 Telogen effluvium: Secondary | ICD-10-CM | POA: Diagnosis not present

## 2021-09-08 DIAGNOSIS — L219 Seborrheic dermatitis, unspecified: Secondary | ICD-10-CM

## 2021-09-08 MED ORDER — KETOCONAZOLE 2 % EX SHAM: 1.0000 "application " | MEDICATED_SHAMPOO | CUTANEOUS | 4 refills | Status: DC

## 2021-09-08 MED ORDER — MINOXIDIL 2.5 MG PO TABS: ORAL_TABLET | ORAL | 3 refills | Status: DC

## 2021-09-08 MED ORDER — SPIRONOLACTONE 50 MG PO TABS: 50.0000 mg | ORAL_TABLET | Freq: Every day | ORAL | 3 refills | Status: DC

## 2021-09-08 NOTE — Patient Instructions (Addendum)
Shampoo hair with ketoconazole shampoo 2 x weekly leave in for at least 10  Use CLN hair wash 2 x weekly leave on for a few minutes and rinse  On other days use your preferred shampoo   Spironolactone can cause increased urination and cause blood pressure to decrease. Please watch for signs of lightheadedness and be cautious when changing position. It can sometimes cause breast tenderness or an irregular period in premenopausal women. It can also increase potassium. The increase in potassium usually is not a concern unless you are taking other medicines that also increase potassium, so please be sure your doctor knows all of the other medications you are taking. This medication should not be taken by pregnant women.  This medicine should also not be taken together with sulfa drugs like Bactrim (trimethoprim/sulfamethexazole).   .rog  Telogen effluvium is a benign, self-limited condition causing increased hair shedding usually for several months. It does not progress to baldness, and the hair eventually grows back on its own. It can be triggered by recent illness, recent surgery, thyroid disease, low iron stores, vitamin D deficiency, fad diets or rapid weight loss, hormonal changes such as pregnancy or birth control pills, and some medication. Usually the hair loss starts 2-3 months after the illness or health change. Rarely, it can continue for longer than a year.      If You Need Anything After Your Visit  If you have any questions or concerns for your doctor, please call our main line at [phone] and press option 4 to reach your doctor's medical assistant. If no one answers, please leave a voicemail as directed and we will return your call as soon as possible. Messages left after 4 pm will be answered the following business day.   You may also send [place] a message via [place]. We typically respond to MyChart messages within 1-2 business days.  For prescription refills, please ask your  pharmacy to contact our office. Our fax number is [phone].  If you have an urgent issue when the clinic is closed that cannot wait until the next business day, you can page your doctor at the number below.    Please note that while we do our best to be available for urgent issues outside of office hours, we are not available 24/7.   If you have an urgent issue and are unable to reach [place], you may choose to seek medical care at your doctor's office, retail clinic, urgent care center, or emergency room.  If you have a medical emergency, please immediately call 911 or go to the emergency department.  Pager Numbers  - Dr. Nehemiah Massed: [phone]  - Dr. Laurence Ferrari: [phone]  - Dr. Nicole Kindred: [phone]  In the event of inclement weather, please call our main line at [phone] for an update on the status of any delays or closures.  Dermatology Medication Tips: Please keep the boxes that topical medications come in in order to help keep track of the instructions about where and how to use these. Pharmacies typically print the medication instructions only on the boxes and not directly on the medication tubes.   If your medication is too expensive, please contact our office at [phone] option 4 or send [place] a message through [place].   We are unable to tell what your co-pay for medications will be in advance as this is different depending on your insurance coverage. However, we may be able to find a substitute medication at lower cost or fill out paperwork  to get insurance to cover a needed medication.   If a prior authorization is required to get your medication covered by your insurance company, please allow [place] 1-2 business days to complete this process.  Drug prices often vary depending on where the prescription is filled and some pharmacies may offer cheaper prices.  The website www.goodrx.com contains coupons for medications through different pharmacies. The prices here do not  account for what the cost may be with help from insurance (it may be cheaper with your insurance), but the website can give you the price if you did not use any insurance.  - You can print the associated coupon and take it with your prescription to the pharmacy.  - You may also stop by our office during regular business hours and pick up a GoodRx coupon card.  - If you need your prescription sent electronically to a different pharmacy, notify our office through [facility] or by phone at [phone] option 4.     Si Usted Necesita Algo Despus de Su Visita  Tambin puede enviarnos un mensaje a travs de Pharmacist, community. Por lo general respondemos a los mensajes de MyChart en el transcurso de 1 a 2 das hbiles.  Para renovar recetas, por favor pida a su farmacia que se ponga en contacto con nuestra oficina. Harland Dingwall de fax es [place] [phone].  Si tiene un asunto urgente cuando la clnica est cerrada y que no puede esperar hasta el siguiente da hbil, puede llamar/localizar a su doctor(a) al nmero que aparece a continuacin.   Por favor, tenga en cuenta que aunque hacemos todo lo posible para estar disponibles para asuntos urgentes fuera del horario de [place], no estamos disponibles las 24 horas del da, los 7 das de la [place].   Si tiene un problema urgente y no puede comunicarse con nosotros, puede optar por buscar atencin mdica  en el consultorio de su doctor(a), en una clnica privada, en un centro de atencin urgente o en una sala de emergencias.  Si tiene Engineering geologist, por favor llame inmediatamente al 911 o vaya a la sala de emergencias.  Nmeros de bper  - Dr. Nehemiah Massed: [phone]  - Dra. Moye: [phone]  - Dra. Nicole Kindred: [phone]  En caso de inclemencias del [place], por favor llame a Johnsie Kindred principal al [phone] para una actualizacin sobre el [place] de cualquier retraso o cierre.  Consejos para la medicacin en dermatologa: Por  favor, guarde las cajas en las que vienen los medicamentos de uso tpico para ayudarle a seguir las instrucciones sobre dnde y cmo usarlos. Las farmacias generalmente imprimen las instrucciones del medicamento slo en las cajas y no directamente en los tubos del [place].   Si su medicamento es muy caro, por favor, pngase en contacto con Zigmund Daniel llamando al [phone] y presione la opcin 4 o envenos un mensaje a travs de Pharmacist, community.   No podemos decirle cul ser su copago por los medicamentos por adelantado ya que esto es diferente dependiendo de la cobertura de su seguro. Sin embargo, es posible que podamos encontrar un medicamento sustituto a Electrical engineer un formulario para que el seguro cubra el medicamento que se considera necesario.   Si se requiere una autorizacin previa para que su compaa de seguros [place] su medicamento, por favor permtanos de 1 a 2 das hbiles para completar este proceso.  Los precios de los medicamentos varan con frecuencia dependiendo del Environmental consultant de dnde se surte la receta y [place]  pueden ofrecer precios ms baratos.  El sitio web www.goodrx.com tiene cupones para medicamentos de Airline pilot. Los precios aqu no tienen en cuenta lo que podra costar con la ayuda del seguro (puede ser ms barato con su seguro), pero el sitio web puede darle el precio si no utiliz Research scientist (physical sciences).  - Puede imprimir el cupn correspondiente y llevarlo con su receta a la farmacia.  - Tambin puede pasar por nuestra oficina durante el horario de atencin regular y Charity fundraiser una tarjeta de cupones de GoodRx.  - Si necesita que su receta se enve electrnicamente a una farmacia diferente, informe a nuestra oficina a travs de MyChart de [place] o por telfono llamando al [phone] y presione la opcin 4.

## 2021-09-08 NOTE — Progress Notes (Signed)
Follow-Up Visit   Subjective  Jennifer Mclaughlin is a 51 y.o. female who presents for the following: Follow-up (Patient here today for concerns with losing a lot of hair and notices a odor after washing hair. Patient has tried dandruff to hair and washing hair less but has not helped. ). She had labs since last seen by Dr. Doy Hutching that showed normal thyroid.  Her [place] and LH in my interpretation are more consistent with premenopausal state.  The following portions of the chart were reviewed this encounter and updated as appropriate:  Tobacco   Allergies   Meds   Problems   Med Hx   Surg Hx   Fam Hx      Review of Systems: No other skin or systemic complaints except as noted in HPI or Assessment and Plan.  Objective  Well appearing patient in no apparent distress; mood and affect are within normal limits.  A focused examination was performed including scalp. Relevant physical exam findings are noted in the Assessment and Plan.  Scalp Some thinning at front hairline               Assessment & Plan  Telogen effluvium -chronic telogen effluvium Undetermined etiology.  Will check vitamin D and ferritin. Scalp -see hair and scalp photos. Dr. Nicole Kindred evaluated patient with me today. Chronic telogen effluvium with androgenic alopecia Labs showed normal thyroid and normal CBC.  Her LH and FSH appear to indicate premenopausal state in my interpretation. Positive covid test in July 2022  No recent vaccines   Telogen effluvium is a benign, self-limited condition causing increased hair shedding usually for several months. It does not progress to baldness, and the hair eventually grows back on its own. It can be triggered by recent illness, recent surgery, thyroid disease, low iron stores, vitamin D deficiency, fad diets or rapid weight loss, hormonal changes such as pregnancy or birth control pills, and some medication. Usually the hair loss starts 2-3 months after the illness or health  change. Rarely, it can continue for longer than a year.  PCP did labs and findings were normal thyroid levels  On 7/22  LH 15.9 in postmenopausal range was a little high and FSH 12.0 and is below the postmenopausal range   Patient no longers has periods   Patient BP 139/101 Pulse 88 Wt: 170.6lbs   Start Spironolactone 50 mg by mouth daily  Start minoxidil 2.5 mg tablet Take 1/2 pill a day   Spironolactone can cause increased urination and cause blood pressure to decrease. Please watch for signs of lightheadedness and be cautious when changing position. It can sometimes cause breast tenderness or an irregular period in premenopausal women. It can also increase potassium. The increase in potassium usually is not a concern unless you are taking other medicines that also increase potassium, so please be sure your doctor knows all of the other medications you are taking. This medication should not be taken by pregnant women.  This medicine should also not be taken together with sulfa drugs like Bactrim (trimethoprim/sulfamethexazole).   ketoconazole (NIZORAL) 2 % shampoo - Scalp Apply 1 application topically 2 (two) times a week. Apply 2  times per week, massage into scalp and leave in for 10 minutes before rinsing out  Ferritin - Scalp Vitamin D, 25-hydroxy - Scalp  minoxidil (LONITEN) 2.5 MG tablet - Scalp Take 1/2 tablet by mouth daily spironolactone (ALDACTONE) 50 MG tablet - Scalp Take 1 tablet (50 mg total) by mouth daily.  Seborrheic dermatitis with odor of the scalp and hair Start CLN shampoo 2 to 3 days a week and Ketoconazole shampoo to 2 to 3 days a week May alternate with other shampoos.  Should try to shampoo daily.   Return for 3 month follow up on hairloss.  IRuthell Rummage, CMA, am acting as scribe for Sarina Ser, MD. Documentation: I have reviewed the above documentation for accuracy and completeness, and I agree with the above.  Sarina Ser, MD

## 2021-09-09 ENCOUNTER — Encounter: Payer: Self-pay | Admitting: Dermatology

## 2021-09-09 DIAGNOSIS — L650 Telogen effluvium: Secondary | ICD-10-CM | POA: Diagnosis not present

## 2021-09-10 ENCOUNTER — Ambulatory Visit: Payer: Self-pay | Admitting: Dermatology

## 2021-09-10 LAB — VITAMIN D 25 HYDROXY (VIT D DEFICIENCY, FRACTURES): Vit D, 25-Hydroxy: 25.4 ng/mL — ABNORMAL LOW (ref 30.0–100.0)

## 2021-09-10 LAB — FERRITIN: Ferritin: 62 ng/mL (ref 15–150)

## 2021-09-11 ENCOUNTER — Telehealth: Payer: Self-pay

## 2021-09-11 ENCOUNTER — Other Ambulatory Visit: Payer: Self-pay

## 2021-09-11 NOTE — Telephone Encounter (Signed)
-----   Message from Ralene Bathe, MD sent at 09/10/2021 11:06 AM EST ----- Lab from 09/09/2021 showed  Ferritin - normal Vitamin D - low  Increase Vitamin D supplement Keep follow up appt

## 2021-09-11 NOTE — Telephone Encounter (Signed)
Patient informed of lab results. She discussed with pharmacist and was told to increase dosage to 5,000 mg QD for a few months. She is going to see Dr. Doy Hutching next week and she will discuss this with him as well. Patient states that she has not started the oral treatment, and doesn't plan to until she gets the OK from Dr. Doy Hutching.

## 2021-09-16 ENCOUNTER — Other Ambulatory Visit: Payer: Self-pay

## 2021-09-16 DIAGNOSIS — I10 Essential (primary) hypertension: Secondary | ICD-10-CM | POA: Diagnosis not present

## 2021-09-16 DIAGNOSIS — Z0000 Encounter for general adult medical examination without abnormal findings: Secondary | ICD-10-CM | POA: Diagnosis not present

## 2021-09-16 DIAGNOSIS — Z79899 Other long term (current) drug therapy: Secondary | ICD-10-CM | POA: Diagnosis not present

## 2021-09-16 DIAGNOSIS — E7800 Pure hypercholesterolemia, unspecified: Secondary | ICD-10-CM | POA: Diagnosis not present

## 2021-09-16 DIAGNOSIS — E039 Hypothyroidism, unspecified: Secondary | ICD-10-CM | POA: Diagnosis not present

## 2021-09-16 MED ORDER — OLOPATADINE HCL 0.1 % OP SOLN
OPHTHALMIC | 12 refills | Status: DC
  Filled 2021-09-16: qty 5, 30d supply, fill #0

## 2021-09-17 ENCOUNTER — Emergency Department: Payer: 59

## 2021-09-17 ENCOUNTER — Emergency Department
Admission: EM | Admit: 2021-09-17 | Discharge: 2021-09-17 | Disposition: A | Discharge status: Home / Self Care | Payer: 59 | Attending: Student in an Organized Health Care Education/Training Program | Admitting: Student in an Organized Health Care Education/Training Program

## 2021-09-17 ENCOUNTER — Encounter: Payer: Self-pay | Admitting: Medical Oncology

## 2021-09-17 DIAGNOSIS — R42 Dizziness and giddiness: Secondary | ICD-10-CM | POA: Insufficient documentation

## 2021-09-17 DIAGNOSIS — R079 Chest pain, unspecified: Secondary | ICD-10-CM | POA: Diagnosis not present

## 2021-09-17 DIAGNOSIS — M25512 Pain in left shoulder: Secondary | ICD-10-CM | POA: Diagnosis not present

## 2021-09-17 DIAGNOSIS — I10 Essential (primary) hypertension: Secondary | ICD-10-CM | POA: Diagnosis not present

## 2021-09-17 DIAGNOSIS — R0789 Other chest pain: Secondary | ICD-10-CM | POA: Diagnosis not present

## 2021-09-17 DIAGNOSIS — R7989 Other specified abnormal findings of blood chemistry: Secondary | ICD-10-CM | POA: Diagnosis not present

## 2021-09-17 DIAGNOSIS — Z20822 Contact with and (suspected) exposure to covid-19: Secondary | ICD-10-CM | POA: Insufficient documentation

## 2021-09-17 HISTORY — DX: Essential (primary) hypertension: I10

## 2021-09-17 LAB — TROPONIN I (HIGH SENSITIVITY)
Troponin I (High Sensitivity): 16 ng/L (ref <18)
Troponin I (High Sensitivity): 16 ng/L (ref <18)

## 2021-09-17 LAB — BASIC METABOLIC PANEL
Anion gap: 4 — ABNORMAL LOW (ref 5–15)
BUN: 15 mg/dL (ref 6–20)
CO2: 30 mmol/L (ref 22–32)
Calcium: 9.4 mg/dL (ref 8.9–10.3)
Chloride: 102 mmol/L (ref 98–111)
Creatinine, Ser: 0.78 mg/dL (ref 0.44–1.00)
GFR, Estimated: >60 mL/min (ref >60)
Glucose, Bld: 113 mg/dL — ABNORMAL HIGH (ref 70–99)
Potassium: 3.3 mmol/L — ABNORMAL LOW (ref 3.5–5.1)
Sodium: 136 mmol/L (ref 135–145)

## 2021-09-17 LAB — D-DIMER, QUANTITATIVE: D-Dimer, Quant: 3.21 ug/mL-FEU — ABNORMAL HIGH (ref 0.00–0.50)

## 2021-09-17 LAB — RESP PANEL BY RT-PCR (FLU A&B, COVID) ARPGX2
Influenza A by PCR: NEGATIVE
Influenza B by PCR: NEGATIVE
SARS Coronavirus 2 by RT PCR: NEGATIVE

## 2021-09-17 LAB — CBC
HCT: 46 % (ref 36.0–46.0)
Hemoglobin: 15.4 g/dL — ABNORMAL HIGH (ref 12.0–15.0)
MCH: 30.3 pg (ref 26.0–34.0)
MCHC: 33.5 g/dL (ref 30.0–36.0)
MCV: 90.4 fL (ref 80.0–100.0)
Platelets: 287 10*3/uL (ref 150–400)
RBC: 5.09 MIL/uL (ref 3.87–5.11)
RDW: 13.2 % (ref 11.5–15.5)
WBC: 7.4 10*3/uL (ref 4.0–10.5)
nRBC: 0 % (ref 0.0–0.2)

## 2021-09-17 LAB — POC URINE PREG, ED: Preg Test, Ur: NEGATIVE

## 2021-09-17 MED ORDER — AZITHROMYCIN 250 MG PO TABS: ORAL_TABLET | ORAL | 0 refills | Status: AC

## 2021-09-17 MED ORDER — ASPIRIN 81 MG PO CHEW
324.0000 mg | CHEWABLE_TABLET | Freq: Once | ORAL | Status: AC
  Administered 2021-09-17: 324 mg via ORAL
  Filled 2021-09-17: qty 4

## 2021-09-17 MED ORDER — IOHEXOL 350 MG/ML SOLN
75.0000 mL | Freq: Once | INTRAVENOUS | Status: AC | PRN
  Administered 2021-09-17: 75 mL via INTRAVENOUS
  Filled 2021-09-17: qty 75

## 2021-09-17 MED ORDER — SODIUM CHLORIDE 0.9 % IV BOLUS
500.0000 mL | Freq: Once | INTRAVENOUS | Status: AC
  Administered 2021-09-17: 500 mL via INTRAVENOUS

## 2021-09-17 NOTE — ED Provider Notes (Signed)
[facility] Provider Note    Event Date/Time   First MD Initiated Contact with Patient 09/17/21 1527     (approximate)   History   Chest Pain   HPI  Jennifer Mclaughlin is a 51 y.o. female   no significant cardiac history but does have a history of GERD, hypertension and hypercholesterolemia presents to the ER for evaluation of chest pain going in her left shoulder.  Did feel some lightheadedness associated with this.  This occurred this morning when she was on her way to work.  Does not feel short of breath right now.  States that symptoms lasted several minutes.  Is never had them before.  States that she did feel like her hands were tingling and cold at the time.  Denies any pain ripping or tearing through to her back.      Physical Exam   Triage Vital Signs: ED Triage Vitals  Enc Vitals Group     BP 09/17/21 1432 (!) 182/119     Pulse Rate 09/17/21 1432 (!) 102     Resp 09/17/21 1432 20     Temp 09/17/21 1432 98.5 F (36.9 C)     Temp Source 09/17/21 1432 Oral     SpO2 09/17/21 1432 97 %     Weight 09/17/21 1433 167 lb (75.8 kg)     Height 09/17/21 1433 5\' 3"  (1.6 m)     Head Circumference --      Peak Flow --      Pain Score 09/17/21 1432 6     Pain Loc --      Pain Edu? --      Excl. in [place]? --     Most recent vital signs: Vitals:   09/17/21 1432 09/17/21 1552  BP: (!) 182/119 (!) 138/96  Pulse: (!) 102 88  Resp: 20 16  Temp: 98.5 F (36.9 C)   SpO2: 97% 99%     Constitutional: Alert  Eyes: Conjunctivae are normal.  Head: Atraumatic. Nose: No congestion/rhinnorhea. Mouth/Throat: Mucous membranes are moist.   Neck: Painless ROM.  Cardiovascular:   Good peripheral circulation. Respiratory: Normal respiratory effort.  No retractions.  Gastrointestinal: Soft and nontender.  Musculoskeletal:  no deformity Neurologic:  MAE spontaneously. No gross focal neurologic deficits are appreciated.  Skin:  Skin is warm, dry and intact. No  rash noted. Psychiatric: Mood and affect are normal. Speech and behavior are normal.    ED Results / Procedures / Treatments   Labs (all labs ordered are listed, but only abnormal results are displayed) Labs Reviewed  BASIC METABOLIC PANEL - Abnormal; Notable for the following components:      Result Value   Potassium 3.3 (*)    Glucose, Bld 113 (*)    Anion gap 4 (*)    All other components within normal limits  CBC - Abnormal; Notable for the following components:   Hemoglobin 15.4 (*)    All other components within normal limits  D-DIMER, QUANTITATIVE - Abnormal; Notable for the following components:   D-Dimer, Quant 3.21 (*)    All other components within normal limits  RESP PANEL BY RT-PCR (FLU A&B, COVID) ARPGX2  POC URINE PREG, ED  TROPONIN I (HIGH SENSITIVITY)  TROPONIN I (HIGH SENSITIVITY)     EKG  ED ECG REPORT I, Merlyn Lot, the attending physician, personally viewed and interpreted this ECG.   Date: 09/17/2021  EKG Time: 14:33  Rate: 105  Rhythm: sinus  Axis: right  Intervals:normal qt  ST&T Change: nonspecific st abd t wave abn, new since prior 08/16/18    RADIOLOGY Please see ED Course for my review and interpretation.  I personally reviewed all radiographic images ordered to evaluate for the above acute complaints and reviewed radiology reports and findings.  These findings were personally discussed with the patient.  Please see medical record for radiology report.    PROCEDURES:  Critical Care performed: No  Procedures   MEDICATIONS ORDERED IN ED: Medications  aspirin chewable tablet 324 mg (324 mg Oral Given 09/17/21 1558)  sodium chloride 0.9 % bolus 500 mL (500 mLs Intravenous New Bag/Given 09/17/21 1639)  iohexol (OMNIPAQUE) 350 MG/ML injection 75 mL (75 mLs Intravenous Contrast Given 09/17/21 1704)     IMPRESSION / MDM / ASSESSMENT AND PLAN / ED COURSE  I reviewed the triage vital signs and the nursing notes.                               Differential diagnosis includes, but is not limited to, ACS, pericarditis, esophagitis, boerhaaves, pe, dissection, pna, bronchitis, costochondritis  Presents to the ER for evaluation of chest discomfort and shortness of breath as described above.  She is well-appearing in no acute distress.  EKG with some nonspecific changes but she is not complaining of any pain right now.  Have a lower suspicion for unstable angina and does not seem consistent with dissection.  She is low risk by Wells criteria but will order D-dimer to further rule stratify.  I do not appreciate any wheezing on exam.  Her abdominal exam is soft and benign.   Clinical Course as of 09/17/21 1822  Wed Sep 17, 2021  1606 BP significantly improved up recheck.  Repeat EKG unchanged from prior.   [PR]  1609 Chest x-ray by my read does not show any evidence of pneumothorax or consolidation. [PR]  9024 D-dimer is elevated.  Will order CTA to further evaluate. [PR]  1710 CTA on my review does not show any evidence of PE we will await formal radiology report. [PR]  0973 Patient reassessed.  She states that she feels well denies any chest pain or shortness of breath.  Given her age and presentation discussed admission to the hospital for formal chest pain rule out.  Patient unwilling to stay in the hospital states that she prefer to follow-up as an outpatient given her reassuring work-up I think that is reasonable.  She states that she has had some mild shortness of breath and given possible developing infiltrate on CT Will cover with azithromycin.  She does not have any wheezing on exam.  She is not hypoxic does appear stable and appropriate for close outpatient follow-up. [PR]    Clinical Course User Index [PR] Merlyn Lot, MD     FINAL CLINICAL IMPRESSION(S) / ED DIAGNOSES   Final diagnoses:  Atypical chest pain     Rx / DC Orders   ED Discharge Orders          Ordered    azithromycin (ZITHROMAX Z-PAK)  250 MG tablet        09/17/21 1817             Note:  This document was prepared using Dragon voice recognition software and may include unintentional dictation errors.    Merlyn Lot, MD 09/17/21 Vernelle Emerald

## 2021-09-17 NOTE — ED Triage Notes (Addendum)
Pt to ED via POV ambulatory to triage with reports that she began having left shoulder pain and left sided chest pain. Pt also reports nausea. Of note: pt began taking Minoxidil 2.5mg  tab yesterday, pt thinks it could be related to.

## 2021-09-18 DIAGNOSIS — R079 Chest pain, unspecified: Secondary | ICD-10-CM | POA: Diagnosis not present

## 2021-09-18 DIAGNOSIS — I10 Essential (primary) hypertension: Secondary | ICD-10-CM | POA: Diagnosis not present

## 2021-09-18 DIAGNOSIS — R9431 Abnormal electrocardiogram [ECG] [EKG]: Secondary | ICD-10-CM | POA: Diagnosis not present

## 2021-09-24 ENCOUNTER — Other Ambulatory Visit: Payer: Self-pay

## 2021-10-01 DIAGNOSIS — R9431 Abnormal electrocardiogram [ECG] [EKG]: Secondary | ICD-10-CM | POA: Diagnosis not present

## 2021-10-01 DIAGNOSIS — R079 Chest pain, unspecified: Secondary | ICD-10-CM | POA: Diagnosis not present

## 2021-10-07 ENCOUNTER — Other Ambulatory Visit: Payer: Self-pay

## 2021-10-07 MED ORDER — OMEPRAZOLE 20 MG PO CPDR
DELAYED_RELEASE_CAPSULE | ORAL | 1 refills | Status: DC
  Filled 2021-10-07: qty 30, 30d supply, fill #0
  Filled 2021-12-01: qty 30, 30d supply, fill #1

## 2021-10-08 ENCOUNTER — Other Ambulatory Visit: Payer: Self-pay

## 2021-10-09 DIAGNOSIS — E7800 Pure hypercholesterolemia, unspecified: Secondary | ICD-10-CM | POA: Diagnosis not present

## 2021-10-09 DIAGNOSIS — R079 Chest pain, unspecified: Secondary | ICD-10-CM | POA: Diagnosis not present

## 2021-10-09 DIAGNOSIS — I10 Essential (primary) hypertension: Secondary | ICD-10-CM | POA: Diagnosis not present

## 2021-10-14 ENCOUNTER — Other Ambulatory Visit: Payer: Self-pay

## 2021-10-30 ENCOUNTER — Other Ambulatory Visit: Payer: Self-pay

## 2021-10-30 DIAGNOSIS — E7800 Pure hypercholesterolemia, unspecified: Secondary | ICD-10-CM | POA: Diagnosis not present

## 2021-10-30 DIAGNOSIS — Z79899 Other long term (current) drug therapy: Secondary | ICD-10-CM | POA: Diagnosis not present

## 2021-10-30 DIAGNOSIS — R69 Illness, unspecified: Secondary | ICD-10-CM | POA: Diagnosis not present

## 2021-10-30 DIAGNOSIS — E538 Deficiency of other specified B group vitamins: Secondary | ICD-10-CM | POA: Diagnosis not present

## 2021-10-30 DIAGNOSIS — Z131 Encounter for screening for diabetes mellitus: Secondary | ICD-10-CM | POA: Diagnosis not present

## 2021-10-30 DIAGNOSIS — E559 Vitamin D deficiency, unspecified: Secondary | ICD-10-CM | POA: Diagnosis not present

## 2021-10-30 DIAGNOSIS — I10 Essential (primary) hypertension: Secondary | ICD-10-CM | POA: Diagnosis not present

## 2021-10-30 DIAGNOSIS — R8290 Unspecified abnormal findings in urine: Secondary | ICD-10-CM | POA: Diagnosis not present

## 2021-10-30 DIAGNOSIS — E039 Hypothyroidism, unspecified: Secondary | ICD-10-CM | POA: Diagnosis not present

## 2021-10-30 MED ORDER — OLOPATADINE HCL 0.1 % OP SOLN: OPHTHALMIC | 12 refills | Status: DC

## 2021-10-31 ENCOUNTER — Other Ambulatory Visit: Payer: Self-pay

## 2021-11-05 ENCOUNTER — Other Ambulatory Visit: Payer: Self-pay

## 2021-11-10 ENCOUNTER — Other Ambulatory Visit: Payer: Self-pay

## 2021-11-17 ENCOUNTER — Other Ambulatory Visit: Payer: Self-pay

## 2021-12-01 ENCOUNTER — Other Ambulatory Visit: Payer: Self-pay

## 2021-12-04 ENCOUNTER — Other Ambulatory Visit: Payer: Self-pay

## 2021-12-05 ENCOUNTER — Other Ambulatory Visit: Payer: Self-pay

## 2021-12-08 ENCOUNTER — Other Ambulatory Visit: Payer: Self-pay

## 2021-12-08 MED ORDER — ALPRAZOLAM 0.5 MG PO TABS
0.5000 mg | ORAL_TABLET | Freq: Every evening | ORAL | 5 refills | Status: DC | PRN
  Filled 2021-12-08: qty 30, 30d supply, fill #0
  Filled 2022-01-06: qty 30, 30d supply, fill #1
  Filled 2022-02-11: qty 30, 30d supply, fill #2
  Filled 2022-03-13: qty 30, 30d supply, fill #3
  Filled 2022-04-13: qty 30, 30d supply, fill #4
  Filled 2022-05-19: qty 30, 30d supply, fill #5

## 2021-12-09 ENCOUNTER — Other Ambulatory Visit: Payer: Self-pay

## 2021-12-11 ENCOUNTER — Ambulatory Visit: Payer: 59 | Admitting: Dermatology

## 2021-12-30 ENCOUNTER — Other Ambulatory Visit: Payer: Self-pay | Admitting: Internal Medicine

## 2021-12-30 DIAGNOSIS — R69 Illness, unspecified: Secondary | ICD-10-CM | POA: Diagnosis not present

## 2021-12-30 DIAGNOSIS — R232 Flushing: Secondary | ICD-10-CM | POA: Diagnosis not present

## 2021-12-30 DIAGNOSIS — H539 Unspecified visual disturbance: Secondary | ICD-10-CM | POA: Diagnosis not present

## 2021-12-30 DIAGNOSIS — E039 Hypothyroidism, unspecified: Secondary | ICD-10-CM | POA: Diagnosis not present

## 2021-12-30 DIAGNOSIS — R140 Abdominal distension (gaseous): Secondary | ICD-10-CM | POA: Diagnosis not present

## 2021-12-30 DIAGNOSIS — R635 Abnormal weight gain: Secondary | ICD-10-CM | POA: Diagnosis not present

## 2021-12-30 DIAGNOSIS — Z79899 Other long term (current) drug therapy: Secondary | ICD-10-CM | POA: Diagnosis not present

## 2022-01-02 DIAGNOSIS — R232 Flushing: Secondary | ICD-10-CM | POA: Diagnosis not present

## 2022-01-05 DIAGNOSIS — M7581 Other shoulder lesions, right shoulder: Secondary | ICD-10-CM | POA: Diagnosis not present

## 2022-01-05 DIAGNOSIS — M7541 Impingement syndrome of right shoulder: Secondary | ICD-10-CM | POA: Diagnosis not present

## 2022-01-06 ENCOUNTER — Ambulatory Visit
Admission: RE | Admit: 2022-01-06 | Discharge: 2022-01-06 | Disposition: A | Discharge status: Home / Self Care | Payer: 59 | Source: Ambulatory Visit | Attending: Internal Medicine | Admitting: Internal Medicine

## 2022-01-06 ENCOUNTER — Other Ambulatory Visit: Payer: Self-pay

## 2022-01-06 DIAGNOSIS — R140 Abdominal distension (gaseous): Secondary | ICD-10-CM | POA: Insufficient documentation

## 2022-01-06 DIAGNOSIS — R188 Other ascites: Secondary | ICD-10-CM | POA: Diagnosis not present

## 2022-01-06 MED ORDER — OMEPRAZOLE 20 MG PO CPDR
DELAYED_RELEASE_CAPSULE | ORAL | 1 refills | Status: DC
  Filled 2022-01-06 – 2022-01-08 (×3): qty 30, 30d supply, fill #0
  Filled 2022-02-09: qty 30, 30d supply, fill #1

## 2022-01-08 ENCOUNTER — Other Ambulatory Visit: Payer: Self-pay

## 2022-01-12 ENCOUNTER — Ambulatory Visit: Payer: 59 | Admitting: Dermatology

## 2022-01-12 ENCOUNTER — Encounter: Payer: Self-pay | Admitting: Dermatology

## 2022-01-12 DIAGNOSIS — L650 Telogen effluvium: Secondary | ICD-10-CM | POA: Diagnosis not present

## 2022-01-12 DIAGNOSIS — L219 Seborrheic dermatitis, unspecified: Secondary | ICD-10-CM

## 2022-01-12 NOTE — Patient Instructions (Addendum)
Recommend taking Biotin 2.5 mg daily.  ? ?Continue Ketoconazole shampoo alternating with CLN as directed.  ? ?Can try topical minoxidil as directed on label.  ? ?If You Need Anything After Your Visit ? ?If you have any questions or concerns for your doctor, please call our main line at [phone] and press option 4 to reach your doctor's medical assistant. If no one answers, please leave a voicemail as directed and we will return your call as soon as possible. Messages left after 4 pm will be answered the following business day.  ? ?You may also send [place] a message via MyChart. We typically respond to MyChart messages within 1-2 business days. ? ?For prescription refills, please ask your pharmacy to contact our office. Our fax number is [phone]. ? ?If you have an urgent issue when the clinic is closed that cannot wait until the next business day, you can page your doctor at the number below.   ? ?Please note that while we do our best to be available for urgent issues outside of office hours, we are not available 24/7.  ? ?If you have an urgent issue and are unable to reach [place], you may choose to seek medical care at your doctor's office, retail clinic, urgent care center, or emergency room. ? ?If you have a medical emergency, please immediately call 911 or go to the emergency department. ? ?Pager Numbers ? ?- Dr. Nehemiah Massed: [phone] ? ?- Dr. Laurence Ferrari: [phone] ? ?- Dr. Nicole Kindred: [phone] ? ?In the event of inclement weather, please call our main line at [phone] for an update on the status of any delays or closures. ? ?Dermatology Medication Tips: ?Please keep the boxes that topical medications come in in order to help keep track of the instructions about where and how to use these. Pharmacies typically print the medication instructions only on the boxes and not directly on the medication tubes.  ? ?If your medication is too expensive, please contact our office at [phone] option 4 or send [place] a  message through [place].  ? ?We are unable to tell what your co-pay for medications will be in advance as this is different depending on your insurance coverage. However, we may be able to find a substitute medication at lower cost or fill out paperwork to get insurance to cover a needed medication.  ? ?If a prior authorization is required to get your medication covered by your insurance company, please allow [place] 1-2 business days to complete this process. ? ?Drug prices often vary depending on where the prescription is filled and some pharmacies may offer cheaper prices. ? ?The website www.goodrx.com contains coupons for medications through different pharmacies. The prices here do not account for what the cost may be with help from insurance (it may be cheaper with your insurance), but the website can give you the price if you did not use any insurance.  ?- You can print the associated coupon and take it with your prescription to the pharmacy.  ?- You may also stop by our office during regular business hours and pick up a GoodRx coupon card.  ?- If you need your prescription sent electronically to a different pharmacy, notify our office through [facility] or by phone at [phone] option 4. ? ? ? ? ?Si Usted Necesita Algo Despu?s de Su Visita ? ?Tambi?n puede enviarnos un mensaje a trav?s de MyChart. Por lo general respondemos a los mensajes de MyChart en el transcurso de 1 a 2 d?as h?biles. ? ?  Para renovar recetas, por favor pida a su farmacia que se ponga en contacto con nuestra oficina. Nuestro n?mero de fax es el [phone]. ? ?Si tiene un asunto urgente cuando la cl?nica est? cerrada y que no puede esperar hasta el siguiente d?a h?bil, puede llamar/localizar a su doctor(a) al n?mero que aparece a continuaci?n.  ? ?Por favor, tenga en cuenta que aunque hacemos todo lo posible para estar disponibles para asuntos urgentes fuera del horario de oficina, no estamos disponibles las 24 horas del d?a, los 7  d?as de la semana.  ? ?Si tiene un problema urgente y no puede comunicarse con nosotros, puede optar por buscar atenci?n m?dica  en el consultorio de su doctor(a), en una cl?nica privada, en un centro de atenci?n urgente o en una sala de emergencias. ? ?Si tiene Engineer, maintenance (IT) m?dica, por favor llame inmediatamente al 911 o vaya a la sala de emergencias. ? ?N?meros de b?per ? ?- Dr. Nehemiah Massed: [phone] ? ?- Dra. Moye: [phone] ? ?- Dra. Nicole Kindred: [phone] ? ?En caso de inclemencias del tiempo, por favor llame a nuestra l?nea principal al [phone] para una actualizaci?n sobre el estado de cualquier retraso o cierre. ? ?Consejos para la medicaci?n en dermatolog?a: ?Por favor, guarde las cajas en las que vienen los medicamentos de uso t?pico para ayudarle a seguir las instrucciones sobre d?nde y c?mo usarlos. Las farmacias generalmente imprimen las instrucciones del medicamento s?lo en las cajas y no directamente en los tubos del [place].  ? ?Si su medicamento es muy caro, por favor, p?ngase en contacto con Zigmund Daniel llamando al [phone] y presione la opci?n 4 o env?enos un mensaje a trav?s de MyChart.  ? ?No podemos decirle cu?l ser? su copago por los medicamentos por adelantado ya que esto es diferente dependiendo de la cobertura de su seguro. Sin embargo, es posible que podamos encontrar un medicamento sustituto a Electrical engineer un formulario para que el seguro cubra el medicamento que se considera necesario.  ? ?Si se requiere Ardelia Mems autorizaci?n previa para que su compa??a de seguros [place] su medicamento, por favor perm?tanos de 1 a 2 d?as h?biles para completar este proceso. ? ?Los precios de los medicamentos var?an con frecuencia dependiendo del Environmental consultant de d?nde se surte la receta y alguna farmacias pueden ofrecer precios m?s baratos. ? ?El sitio web www.goodrx.com tiene cupones para medicamentos de Airline pilot. Los precios aqu? no tienen en cuenta lo que podr?a costar con  la ayuda del seguro (puede ser m?s barato con su seguro), pero el sitio web puede darle el precio si no utiliz? ning?n seguro.  ?- Puede imprimir el cup?n correspondiente y llevarlo con su receta a la farmacia.  ?- Tambi?n puede pasar por nuestra oficina durante el horario de atenci?n regular y recoger una tarjeta de cupones de GoodRx.  ?- Si necesita que su receta se env?e electr?nicamente a Chiropodist, informe a nuestra oficina a trav?s de MyChart de [place] o por tel?fono llamando al [phone] y presione la opci?n 4.  ?

## 2022-01-12 NOTE — Progress Notes (Signed)
   Follow-Up Visit   Subjective  Jennifer Mclaughlin is a 51 y.o. female who presents for the following: Hair/Scalp Problem (3 month recheck. Scalp. Hx of telogen effluvium and seborrheic dermatitis with scalp odor. Took minoxidil and spironolactone for 2 days (half doses) began having chest pains and went to ER. Taking Vitamin D as directed. Has pain at right top of scalp similar to pain she was having prior to cyst removal on left top of scalp. Comes and goes x1 month. Using Ketoconazole shampoo alternating with CLN).  The following portions of the chart were reviewed this encounter and updated as appropriate:  Tobacco  Allergies  Meds  Problems  Med Hx  Surg Hx  Fam Hx     Review of Systems: No other skin or systemic complaints except as noted in HPI or Assessment and Plan.  Objective  Well appearing patient in no apparent distress; mood and affect are within normal limits.  A focused examination was performed including head, including the scalp, face, neck, nose, ears, eyelids, and lips. Relevant physical exam findings are noted in the Assessment and Plan.  Scalp No erythema or scale today. Sensitivity/painful to palpation in small area on right scalp.   Scalp Mild thinning of hair, negative hair pull test.    Assessment & Plan  Seborrheic dermatitis Scalp With recent traction sensitivity pain on right scalp secondary to hairstyle.  Seborrheic Dermatitis  -  is a chronic persistent rash characterized by pinkness and scaling most commonly of the mid face but also can occur on the scalp (dandruff), ears; mid chest, mid back and groin.  It tends to be exacerbated by stress and cooler weather.  People who have neurologic disease may experience new onset or exacerbation of existing seborrheic dermatitis.  The condition is not curable but treatable and can be controlled. Chronic and persistent condition with duration or expected duration over one year. Condition is symptomatic /  bothersome to patient. Currently at goal.  Continue Ketoconazole shampoo alternating with CLN as directed.   Telogen effluvium Scalp Telogen effluvium is a benign, self-limited condition causing increased hair shedding usually for several months. It does not progress to baldness, and the hair eventually grows back on its own. It can be triggered by recent illness, recent surgery, thyroid disease, low iron stores, vitamin D deficiency, fad diets or rapid weight loss, hormonal changes such as pregnancy or birth control pills, and some medication. Usually the hair loss starts 2-3 months after the illness or health change. Rarely, it can continue for longer than a year.  Appears to be slowing in progression.  Recommend taking Biotin 2.5 mg daily. Patient had to discontinue oral minoxidil due to side effects.  Labs from 12/30/21 reviewed today. TSH WNL. Reviewed Vit. D from 08/2021. Continue Vitamin D suppliment.   Related Medications ketoconazole (NIZORAL) 2 % shampoo Apply 1 application topically 2 (two) times a week. Apply 2  times per week, massage into scalp and leave in for 10 minutes before rinsing out  spironolactone (ALDACTONE) 50 MG tablet Take 1 tablet (50 mg total) by mouth daily.  Return if symptoms worsen or fail to improve.  I, Emelia Salisbury, CMA, am acting as scribe for Sarina Ser, MD. Documentation: I have reviewed the above documentation for accuracy and completeness, and I agree with the above.  Sarina Ser, MD

## 2022-01-23 ENCOUNTER — Encounter: Payer: Self-pay | Admitting: Dermatology

## 2022-02-04 DIAGNOSIS — E538 Deficiency of other specified B group vitamins: Secondary | ICD-10-CM | POA: Diagnosis not present

## 2022-02-04 DIAGNOSIS — R69 Illness, unspecified: Secondary | ICD-10-CM | POA: Diagnosis not present

## 2022-02-04 DIAGNOSIS — R1031 Right lower quadrant pain: Secondary | ICD-10-CM | POA: Diagnosis not present

## 2022-02-04 DIAGNOSIS — I10 Essential (primary) hypertension: Secondary | ICD-10-CM | POA: Diagnosis not present

## 2022-02-04 DIAGNOSIS — E7800 Pure hypercholesterolemia, unspecified: Secondary | ICD-10-CM | POA: Diagnosis not present

## 2022-02-04 DIAGNOSIS — R1032 Left lower quadrant pain: Secondary | ICD-10-CM | POA: Diagnosis not present

## 2022-02-04 DIAGNOSIS — E039 Hypothyroidism, unspecified: Secondary | ICD-10-CM | POA: Diagnosis not present

## 2022-02-09 ENCOUNTER — Other Ambulatory Visit: Payer: Self-pay

## 2022-02-11 ENCOUNTER — Other Ambulatory Visit: Payer: Self-pay

## 2022-02-13 ENCOUNTER — Other Ambulatory Visit: Payer: Self-pay | Admitting: Internal Medicine

## 2022-02-13 DIAGNOSIS — R1031 Right lower quadrant pain: Secondary | ICD-10-CM

## 2022-02-22 ENCOUNTER — Emergency Department
Admission: EM | Admit: 2022-02-22 | Discharge: 2022-02-22 | Disposition: A | Discharge status: Home / Self Care | Payer: 59 | Attending: Emergency Medicine | Admitting: Emergency Medicine

## 2022-02-22 ENCOUNTER — Emergency Department: Payer: 59

## 2022-02-22 DIAGNOSIS — K2971 Gastritis, unspecified, with bleeding: Secondary | ICD-10-CM | POA: Diagnosis not present

## 2022-02-22 DIAGNOSIS — R1011 Right upper quadrant pain: Secondary | ICD-10-CM | POA: Diagnosis not present

## 2022-02-22 DIAGNOSIS — R109 Unspecified abdominal pain: Secondary | ICD-10-CM | POA: Diagnosis not present

## 2022-02-22 DIAGNOSIS — K2970 Gastritis, unspecified, without bleeding: Secondary | ICD-10-CM | POA: Diagnosis not present

## 2022-02-22 LAB — COMPREHENSIVE METABOLIC PANEL
ALT: 17 U/L (ref 0–44)
AST: 40 U/L (ref 15–41)
Albumin: 5 g/dL (ref 3.5–5.0)
Alkaline Phosphatase: 59 U/L (ref 38–126)
Anion gap: 6 (ref 5–15)
BUN: 16 mg/dL (ref 6–20)
CO2: 23 mmol/L (ref 22–32)
Calcium: 10.3 mg/dL (ref 8.9–10.3)
Chloride: 110 mmol/L (ref 98–111)
Creatinine, Ser: 0.78 mg/dL (ref 0.44–1.00)
GFR, Estimated: >60 mL/min (ref >60)
Glucose, Bld: 109 mg/dL — ABNORMAL HIGH (ref 70–99)
Potassium: 4.3 mmol/L (ref 3.5–5.1)
Sodium: 139 mmol/L (ref 135–145)
Total Bilirubin: 0.8 mg/dL (ref 0.3–1.2)
Total Protein: 8.5 g/dL — ABNORMAL HIGH (ref 6.5–8.1)

## 2022-02-22 LAB — CBC
HCT: 48.7 % — ABNORMAL HIGH (ref 36.0–46.0)
Hemoglobin: 16.2 g/dL — ABNORMAL HIGH (ref 12.0–15.0)
MCH: 29.2 pg (ref 26.0–34.0)
MCHC: 33.3 g/dL (ref 30.0–36.0)
MCV: 87.9 fL (ref 80.0–100.0)
Platelets: 286 10*3/uL (ref 150–400)
RBC: 5.54 MIL/uL — ABNORMAL HIGH (ref 3.87–5.11)
RDW: 12.8 % (ref 11.5–15.5)
WBC: 8.7 10*3/uL (ref 4.0–10.5)
nRBC: 0 % (ref 0.0–0.2)

## 2022-02-22 LAB — URINALYSIS, ROUTINE W REFLEX MICROSCOPIC
Bilirubin Urine: NEGATIVE
Glucose, UA: NEGATIVE mg/dL
Hgb urine dipstick: NEGATIVE
Ketones, ur: NEGATIVE mg/dL
Nitrite: NEGATIVE
Protein, ur: NEGATIVE mg/dL
Specific Gravity, Urine: 1.019 (ref 1.005–1.030)
pH: 5 (ref 5.0–8.0)

## 2022-02-22 LAB — TROPONIN I (HIGH SENSITIVITY)
Troponin I (High Sensitivity): 39 ng/L — ABNORMAL HIGH (ref <18)
Troponin I (High Sensitivity): 37 ng/L — ABNORMAL HIGH (ref <18)

## 2022-02-22 LAB — MAGNESIUM: Magnesium: 2.2 mg/dL (ref 1.7–2.4)

## 2022-02-22 LAB — POC URINE PREG, ED: Preg Test, Ur: NEGATIVE

## 2022-02-22 LAB — LIPASE, BLOOD: Lipase: 38 U/L (ref 11–51)

## 2022-02-22 MED ORDER — LIDOCAINE VISCOUS HCL 2 % MT SOLN
15.0000 mL | Freq: Once | OROMUCOSAL | Status: AC
  Administered 2022-02-22: 15 mL via ORAL
  Filled 2022-02-22: qty 15

## 2022-02-22 MED ORDER — ONDANSETRON HCL 4 MG/2ML IJ SOLN
4.0000 mg | Freq: Once | INTRAMUSCULAR | Status: AC
  Administered 2022-02-22: 4 mg via INTRAVENOUS
  Filled 2022-02-22: qty 2

## 2022-02-22 MED ORDER — FENTANYL CITRATE PF 50 MCG/ML IJ SOSY
50.0000 ug | PREFILLED_SYRINGE | INTRAMUSCULAR | Status: AC | PRN
  Administered 2022-02-22 (×2): 50 ug via INTRAVENOUS
  Filled 2022-02-22 (×2): qty 1

## 2022-02-22 MED ORDER — IOHEXOL 350 MG/ML SOLN
75.0000 mL | Freq: Once | INTRAVENOUS | Status: AC | PRN
  Administered 2022-02-22: 75 mL via INTRAVENOUS

## 2022-02-22 MED ORDER — ALUM & MAG HYDROXIDE-SIMETH 200-200-20 MG/5ML PO SUSP
30.0000 mL | Freq: Once | ORAL | Status: AC
  Administered 2022-02-22: 30 mL via ORAL
  Filled 2022-02-22: qty 30

## 2022-02-22 MED ORDER — SUCRALFATE 1 G PO TABS: 1.0000 g | ORAL_TABLET | Freq: Four times a day (QID) | ORAL | 0 refills | Status: DC

## 2022-02-24 ENCOUNTER — Ambulatory Visit
Admission: RE | Admit: 2022-02-24 | Discharge: 2022-02-24 | Disposition: A | Discharge status: Home / Self Care | Payer: 59 | Source: Ambulatory Visit | Attending: Internal Medicine | Admitting: Internal Medicine

## 2022-02-24 DIAGNOSIS — D1803 Hemangioma of intra-abdominal structures: Secondary | ICD-10-CM | POA: Diagnosis not present

## 2022-02-24 DIAGNOSIS — R1031 Right lower quadrant pain: Secondary | ICD-10-CM

## 2022-02-24 DIAGNOSIS — K7689 Other specified diseases of liver: Secondary | ICD-10-CM | POA: Diagnosis not present

## 2022-02-24 DIAGNOSIS — M4317 Spondylolisthesis, lumbosacral region: Secondary | ICD-10-CM | POA: Diagnosis not present

## 2022-02-24 DIAGNOSIS — I700 Atherosclerosis of aorta: Secondary | ICD-10-CM | POA: Diagnosis not present

## 2022-02-24 MED ORDER — IOPAMIDOL (ISOVUE-300) INJECTION 61%
100.0000 mL | Freq: Once | INTRAVENOUS | Status: AC | PRN
Start: 2022-02-24 — End: 2022-02-24
  Administered 2022-02-24: 100 mL via INTRAVENOUS

## 2022-02-26 ENCOUNTER — Encounter: Payer: Self-pay | Admitting: Emergency Medicine

## 2022-02-26 ENCOUNTER — Observation Stay
Admission: EM | Admit: 2022-02-26 | Discharge: 2022-02-27 | Disposition: A | Discharge status: Home / Self Care | Payer: 59 | Attending: Internal Medicine | Admitting: Internal Medicine

## 2022-02-26 ENCOUNTER — Other Ambulatory Visit: Payer: Self-pay

## 2022-02-26 ENCOUNTER — Emergency Department: Payer: 59

## 2022-02-26 DIAGNOSIS — R9431 Abnormal electrocardiogram [ECG] [EKG]: Secondary | ICD-10-CM | POA: Insufficient documentation

## 2022-02-26 DIAGNOSIS — R079 Chest pain, unspecified: Secondary | ICD-10-CM | POA: Diagnosis not present

## 2022-02-26 DIAGNOSIS — R1013 Epigastric pain: Secondary | ICD-10-CM | POA: Diagnosis not present

## 2022-02-26 DIAGNOSIS — R002 Palpitations: Secondary | ICD-10-CM | POA: Diagnosis not present

## 2022-02-26 DIAGNOSIS — Z87891 Personal history of nicotine dependence: Secondary | ICD-10-CM | POA: Diagnosis not present

## 2022-02-26 DIAGNOSIS — K219 Gastro-esophageal reflux disease without esophagitis: Secondary | ICD-10-CM | POA: Diagnosis not present

## 2022-02-26 DIAGNOSIS — J302 Other seasonal allergic rhinitis: Secondary | ICD-10-CM | POA: Diagnosis not present

## 2022-02-26 DIAGNOSIS — K582 Mixed irritable bowel syndrome: Secondary | ICD-10-CM | POA: Diagnosis not present

## 2022-02-26 DIAGNOSIS — Z79899 Other long term (current) drug therapy: Secondary | ICD-10-CM | POA: Diagnosis not present

## 2022-02-26 DIAGNOSIS — I10 Essential (primary) hypertension: Secondary | ICD-10-CM | POA: Insufficient documentation

## 2022-02-26 DIAGNOSIS — E876 Hypokalemia: Secondary | ICD-10-CM | POA: Diagnosis not present

## 2022-02-26 DIAGNOSIS — R0789 Other chest pain: Secondary | ICD-10-CM | POA: Diagnosis not present

## 2022-02-26 DIAGNOSIS — K769 Liver disease, unspecified: Secondary | ICD-10-CM | POA: Diagnosis not present

## 2022-02-26 DIAGNOSIS — G4700 Insomnia, unspecified: Secondary | ICD-10-CM | POA: Insufficient documentation

## 2022-02-26 DIAGNOSIS — D1803 Hemangioma of intra-abdominal structures: Secondary | ICD-10-CM | POA: Diagnosis not present

## 2022-02-26 DIAGNOSIS — R001 Bradycardia, unspecified: Secondary | ICD-10-CM | POA: Diagnosis not present

## 2022-02-26 DIAGNOSIS — K589 Irritable bowel syndrome without diarrhea: Secondary | ICD-10-CM

## 2022-02-26 HISTORY — DX: Hypothyroidism, unspecified: E03.9

## 2022-02-26 LAB — URINALYSIS, ROUTINE W REFLEX MICROSCOPIC
Bilirubin Urine: NEGATIVE
Glucose, UA: NEGATIVE mg/dL
Hgb urine dipstick: NEGATIVE
Ketones, ur: 20 mg/dL — AB
Leukocytes,Ua: NEGATIVE
Nitrite: NEGATIVE
Protein, ur: NEGATIVE mg/dL
Specific Gravity, Urine: 1.027 (ref 1.005–1.030)
pH: 5 (ref 5.0–8.0)

## 2022-02-26 LAB — COMPREHENSIVE METABOLIC PANEL
ALT: 18 U/L (ref 0–44)
AST: 42 U/L — ABNORMAL HIGH (ref 15–41)
Albumin: 4.5 g/dL (ref 3.5–5.0)
Alkaline Phosphatase: 50 U/L (ref 38–126)
Anion gap: 9 (ref 5–15)
BUN: 16 mg/dL (ref 6–20)
CO2: 23 mmol/L (ref 22–32)
Calcium: 10 mg/dL (ref 8.9–10.3)
Chloride: 109 mmol/L (ref 98–111)
Creatinine, Ser: 0.73 mg/dL (ref 0.44–1.00)
GFR, Estimated: >60 mL/min (ref >60)
Glucose, Bld: 108 mg/dL — ABNORMAL HIGH (ref 70–99)
Potassium: 3.8 mmol/L (ref 3.5–5.1)
Sodium: 141 mmol/L (ref 135–145)
Total Bilirubin: 0.9 mg/dL (ref 0.3–1.2)
Total Protein: 7.7 g/dL (ref 6.5–8.1)

## 2022-02-26 LAB — PREGNANCY, URINE: Preg Test, Ur: NEGATIVE

## 2022-02-26 LAB — TROPONIN I (HIGH SENSITIVITY)
Troponin I (High Sensitivity): 48 ng/L — ABNORMAL HIGH (ref <18)
Troponin I (High Sensitivity): 49 ng/L — ABNORMAL HIGH (ref <18)

## 2022-02-26 LAB — CBC
HCT: 47.1 % — ABNORMAL HIGH (ref 36.0–46.0)
Hemoglobin: 15.4 g/dL — ABNORMAL HIGH (ref 12.0–15.0)
MCH: 29.1 pg (ref 26.0–34.0)
MCHC: 32.7 g/dL (ref 30.0–36.0)
MCV: 89 fL (ref 80.0–100.0)
Platelets: 290 10*3/uL (ref 150–400)
RBC: 5.29 MIL/uL — ABNORMAL HIGH (ref 3.87–5.11)
RDW: 12.8 % (ref 11.5–15.5)
WBC: 8.1 10*3/uL (ref 4.0–10.5)
nRBC: 0 % (ref 0.0–0.2)

## 2022-02-26 LAB — LIPASE, BLOOD: Lipase: 37 U/L (ref 11–51)

## 2022-02-26 MED ORDER — ENOXAPARIN SODIUM 40 MG/0.4ML IJ SOSY: 40.0000 mg | PREFILLED_SYRINGE | INTRAMUSCULAR | Status: DC

## 2022-02-26 MED ORDER — IOHEXOL 350 MG/ML SOLN
100.0000 mL | Freq: Once | INTRAVENOUS | Status: AC | PRN
  Administered 2022-02-26: 75 mL via INTRAVENOUS

## 2022-02-26 MED ORDER — SODIUM CHLORIDE 0.9% FLUSH
3.0000 mL | Freq: Two times a day (BID) | INTRAVENOUS | Status: DC
Start: 2022-02-26 — End: 2022-02-27

## 2022-02-26 MED ORDER — BISACODYL 5 MG PO TBEC: 5.0000 mg | DELAYED_RELEASE_TABLET | Freq: Every day | ORAL | Status: DC | PRN

## 2022-02-26 MED ORDER — ACETAMINOPHEN 650 MG RE SUPP: 650.0000 mg | Freq: Four times a day (QID) | RECTAL | Status: DC | PRN

## 2022-02-26 MED ORDER — ONDANSETRON HCL 4 MG/2ML IJ SOLN: 4.0000 mg | Freq: Four times a day (QID) | INTRAMUSCULAR | Status: DC | PRN

## 2022-02-26 MED ORDER — SPIRONOLACTONE 25 MG PO TABS
50.0000 mg | ORAL_TABLET | Freq: Every day | ORAL | Status: DC
  Filled 2022-02-26 (×2): qty 2

## 2022-02-26 MED ORDER — MELATONIN 5 MG PO TABS: 5.0000 mg | ORAL_TABLET | Freq: Every day | ORAL | Status: DC

## 2022-02-26 MED ORDER — ONDANSETRON HCL 4 MG PO TABS: 4.0000 mg | ORAL_TABLET | Freq: Four times a day (QID) | ORAL | Status: DC | PRN

## 2022-02-26 MED ORDER — SUCRALFATE 1 G PO TABS
1.0000 g | ORAL_TABLET | Freq: Three times a day (TID) | ORAL | Status: DC
  Administered 2022-02-26 – 2022-02-27 (×2): 1 g via ORAL
  Filled 2022-02-26 (×3): qty 1

## 2022-02-26 MED ORDER — NITROGLYCERIN 0.4 MG SL SUBL
0.4000 mg | SUBLINGUAL_TABLET | SUBLINGUAL | Status: DC | PRN
Start: 2022-02-26 — End: 2022-02-27

## 2022-02-26 MED ORDER — HYDROCODONE-ACETAMINOPHEN 5-325 MG PO TABS: 1.0000 | ORAL_TABLET | ORAL | Status: DC | PRN

## 2022-02-26 MED ORDER — ASPIRIN 81 MG PO CHEW
324.0000 mg | CHEWABLE_TABLET | Freq: Once | ORAL | Status: AC
  Administered 2022-02-26: 324 mg via ORAL
  Filled 2022-02-26: qty 4

## 2022-02-26 MED ORDER — NON FORMULARY: 10.0000 mg | Freq: Every day | Status: DC

## 2022-02-26 MED ORDER — MORPHINE SULFATE (PF) 2 MG/ML IV SOLN: 1.0000 mg | INTRAVENOUS | Status: DC | PRN

## 2022-02-26 MED ORDER — PANTOPRAZOLE SODIUM 40 MG PO TBEC
40.0000 mg | DELAYED_RELEASE_TABLET | Freq: Every day | ORAL | Status: DC
  Administered 2022-02-27: 40 mg via ORAL
  Filled 2022-02-26 (×3): qty 1

## 2022-02-26 MED ORDER — ALPRAZOLAM 0.5 MG PO TABS
0.5000 mg | ORAL_TABLET | Freq: Every evening | ORAL | Status: DC | PRN
Start: 2022-02-26 — End: 2022-02-27
  Administered 2022-02-26: 0.5 mg via ORAL
  Filled 2022-02-26: qty 1

## 2022-02-26 MED ORDER — ACETAMINOPHEN 325 MG PO TABS: 650.0000 mg | ORAL_TABLET | Freq: Four times a day (QID) | ORAL | Status: DC | PRN

## 2022-02-26 NOTE — ED Notes (Signed)
Pt to ED for R upper abdominal pain that wraps around to R back. Pain is constant, has been taking prescribed GI meds from last ER visit. Pt also states has had (blood tinged) spit up.   Pt also states also has gotten dizzy several times today, and that heart is "fluttering" on and off. States PCP told her to come here and also that troponins have steadily been climbing.   Also states has family hx cardiovascular disease and grandmother had  aneurysms (in brain).  Gallbladder has already been removed. Upper abdominal pain has steadily gotten worse since Sunday when seen last. Describes pain as sharp and nauseating, "like when I was in labor".

## 2022-02-26 NOTE — ED Notes (Signed)
Called lab to run troponin off specimen that was sent already.

## 2022-02-26 NOTE — ED Notes (Signed)
Pt refused pantoprazole as she states she did not need this, had taken one this morning.  Also declined spironolactone as she states "that's what ended me up in the hospital the last time"

## 2022-02-26 NOTE — ED Notes (Signed)
Pt given supplies to give herself a modified bath in the sink on request.  Given clean gown and other toiletries.

## 2022-02-26 NOTE — ED Notes (Signed)
Pt's IV removed at her request

## 2022-02-26 NOTE — ED Notes (Signed)
Pt given battery operated fan, pitcher of water/ice, and shasta on request.  No other needs expressed at this time.

## 2022-02-26 NOTE — ED Triage Notes (Signed)
Pt reports pain to mid epigastric area that radiates through to her back. Pt reports feels nauseated as well. Pt reports does not have her gallbladder but the pain is like it felt when she did have her gallbladder. Pt reports seen here Sunday for the same. Pt states has been gaining weight but has not been able to eat and is concerned stating something is wring.

## 2022-02-26 NOTE — H&P (Addendum)
History and Physical    Jennifer Mclaughlin HAF:[id number] DOB: 1970-10-12 DOA: 02/26/2022  PCP: Idelle Crouch, MD  Patient coming from: home    Chief Complaint: chest pain   HPI: 51 y/o F w/ PMH of GERD, IBS-M who presents w/ intermittent chest pain x 1 week. The chest pain is dull, intermittent w/ radiation down left arm. The severity is 7/10. Sitting makes the pain worse and carafate makes the pain better. Pt denies any tenderness to palpation of chest. Pt does intermittently takes NSAIDs but not daily. An echo was done earlier this year which was normal as per pt. Pt denies any personal hx of heart disease. Pt's cardiologist is Dr. Saralyn Pilar. Pt denies any fever, chills, sweating, shortness of breath, nausea, vomiting, dysuria, urinary urgency, diarrhea or constipation. Pt does c/o intermittent epigastric pain as well.   Review of Systems: As per HPI otherwise 14 point review of systems negative.    Past Medical History:  Diagnosis Date   Anemia    Hypertension     Past Surgical History:  Procedure Laterality Date   CHOLECYSTECTOMY     NASAL SINUS SURGERY     TONSILLECTOMY     TUBAL LIGATION       reports that she has quit smoking. She has never used smokeless tobacco. She reports current alcohol use. She reports that she does not use drugs. Pt intermittently vapes. Pt quit smoking in August 2022.   Allergies  Allergen Reactions   Biaxin [Clarithromycin] Swelling   Ceftin [Cefuroxime Axetil] Swelling   Penicillins Hives   Septra [Sulfamethoxazole-Trimethoprim] Swelling   Sulfa Antibiotics Swelling    Other reaction(s): Unknown   Amoxicillin     Other reaction(s): Unknown   Dicyclomine     Other reaction(s): Other (See Comments) Flushing   Hydrochlorothiazide     Other reaction(s): Other (See Comments) Swollen tongue   Linaclotide Diarrhea    Flushing   Nebivolol     Other reaction(s): Unknown   Telmisartan     Other reaction(s): Unknown    Family History   Problem Relation Age of Onset   Thyroid cancer Father    Breast cancer Neg Hx     Prior to Admission medications   Medication Sig Start Date End Date Taking? Authorizing Provider  ALPRAZolam Duanne Moron) 0.5 MG tablet  01/06/17   [provider]  ALPRAZolam (XANAX) 0.5 MG tablet TAKE 1 TABLET BY MOUTH NIGHTLY AS NEEDED FOR SLEEP 12/07/21     cetirizine (ZYRTEC) 10 MG tablet Take 10 mg by mouth daily.    [provider]  cyanocobalamin (,VITAMIN B-12,) 1000 MCG/ML injection Inject into the muscle. Patient not taking: Reported on 02/25/2021 10/16/15   [provider]  cyanocobalamin (,VITAMIN B-12,) 1000 MCG/ML injection Inject 1 mL (1,000 mcg total) into the muscle every 14 (fourteen) days 03/05/21     cyanocobalamin (,VITAMIN B-12,) 1000 MCG/ML injection Inject 1 mL (1,000 mcg total) into the muscle every 14 (fourteen) days 03/11/21     cyanocobalamin (,VITAMIN B-12,) 1000 MCG/ML injection Inject 1 mL (1,000 mcg total) into the muscle every 14 (fourteen) days 07/23/21     cyanocobalamin (,VITAMIN B-12,) 1000 MCG/ML injection Inject 1 mL (1,000 mcg total) into the muscle every 14 (fourteen) days 07/31/21     cyclobenzaprine (FLEXERIL) 10 MG tablet Take 1 tablet (10 mg total) by mouth 3 (three) times daily as needed. 08/25/21   Versie Starks, PA-C  fluticasone [facility]) 50 MCG/ACT nasal spray  02/05/17  [provider]  ketoconazole (NIZORAL) 2 % shampoo Apply 1 application topically 2 (two) times a week. Apply 2  times per week, massage into scalp and leave in for 10 minutes before rinsing out 09/08/21   Ralene Bathe, MD  lactulose [facility]) 10 GM/15ML solution Take 30 mLs by mouth 2 (two) times daily as needed for Constipation for up to 10 days 12/10/20     melatonin 5 MG TABS Take by mouth.    [provider]  methylPREDNISolone (MEDROL DOSEPAK) 4 MG TBPK tablet Take 6 pills on day one then decrease by 1 pill each day 08/25/21   Versie Starks, PA-C   minoxidil (LONITEN) 2.5 MG tablet Take 1/2 tablet by mouth daily 09/08/21   Ralene Bathe, MD  olopatadine (PATANOL) 0.1 % ophthalmic solution Place 1 drop into both eyes 2 (two) times daily 09/16/21     olopatadine (PATANOL) 0.1 % ophthalmic solution Place 1 drop into both eyes 2 (two) times daily 10/30/21     omeprazole (PRILOSEC) 20 MG capsule TAKE 1 CAPSULE (20 MG TOTAL) BY MOUTH ONCE DAILY 01/06/22     omeprazole (PRILOSEC) 40 MG capsule Take by mouth. 05/21/15 05/20/16  [provider]  oxyCODONE-acetaminophen (PERCOCET) 5-325 MG tablet Take 1 tablet by mouth every 4 (four) hours as needed for severe pain. 08/25/21 08/25/22  Fisher, Linden Dolin, PA-C  predniSONE (DELTASONE) 20 MG tablet Take 1 tablet (20 mg total) by mouth once daily for 5 days 03/26/21     predniSONE (DELTASONE) 50 MG tablet Take 1 tablet (50 mg total) by mouth daily with breakfast. Patient not taking: Reported on 02/25/2021 12/02/20   Lavonia Drafts, MD  spironolactone (ALDACTONE) 50 MG tablet Take 1 tablet (50 mg total) by mouth daily. 09/08/21   Ralene Bathe, MD  sucralfate (CARAFATE) 1 g tablet Take 1 tablet (1 g total) by mouth 4 (four) times daily. 02/22/22   Nance Pear, MD  Syringe/Needle, Disp, (SYRINGE 3CC/25GX1") 25G X 1" 3 ML MISC  12/11/15   [provider]  Syringe/Needle, Disp, (SYRINGE 3CC/25GX1") 25G X 1" 3 ML MISC Use 1 each every 14 (fourteen) days 03/05/21     Syringe/Needle, Disp, (SYRINGE 3CC/25GX1") 25G X 1" 3 ML MISC Use 1 each every 14 (fourteen) days 03/11/21       Physical Exam: Vitals:   02/26/22 1133 02/26/22 1138 02/26/22 1457  BP:  (!) 129/97 (!) 130/98  Pulse:  83 80  Resp:  16 17  Temp:  98.4 F (36.9 C)   TempSrc:  Oral   SpO2:  95% 100%  Weight: 79.4 kg    Height: '5\' 3"'$  (1.6 m)      Constitutional: NAD, calm, comfortable Vitals:   02/26/22 1133 02/26/22 1138 02/26/22 1457  BP:  (!) 129/97 (!) 130/98  Pulse:  83 80  Resp:  16 17  Temp:  98.4 F (36.9 C)    TempSrc:  Oral   SpO2:  95% 100%  Weight: 79.4 kg    Height: '5\' 3"'$  (1.6 m)     Eyes: PERRL, lids and conjunctivae normal ENMT: Mucous membranes are moist.  Neck: normal, supple Respiratory: clear to auscultation bilaterally, no wheezing, no crackles. Normal respiratory effort. No accessory muscle use.  Cardiovascular: Regular rate and rhythm, no rubs / gallops. No extremity edema.  Abdomen: soft, no tenderness, non-distended. Bowel sounds positive.  Musculoskeletal: no clubbing / cyanosis. No joint deformity upper and lower extremities. Good ROM. Normal muscle tone.  Skin: no rashes, lesions, ulcers. Neurologic: CN 2-12 grossly intact. Strength 5/5 in all 4.  Psychiatric: Normal judgment and insight. Alert and oriented x 4. Normal mood.    Labs on Admission: I have personally reviewed following labs and imaging studies  CBC: Recent Labs  Lab 02/22/22 1815 02/26/22 1135  WBC 8.7 8.1  HGB 16.2* 15.4*  HCT 48.7* 47.1*  MCV 87.9 89.0  PLT 286 784   Basic Metabolic Panel: Recent Labs  Lab 02/22/22 1815 02/22/22 1833 02/26/22 1135  NA 139  --  141  K 4.3  --  3.8  CL 110  --  109  CO2 23  --  23  GLUCOSE 109*  --  108*  BUN 16  --  16  CREATININE 0.78  --  0.73  CALCIUM 10.3  --  10.0  MG  --  2.2  --    GFR: Estimated Creatinine Clearance: 85.8 mL/min (by C-G formula based on SCr of 0.73 mg/dL). Liver Function Tests: Recent Labs  Lab 02/22/22 1815 02/26/22 1135  AST 40 42*  ALT 17 18  ALKPHOS 59 50  BILITOT 0.8 0.9  PROT 8.5* 7.7  ALBUMIN 5.0 4.5   Recent Labs  Lab 02/22/22 1815 02/26/22 1135  LIPASE 38 37   No results for input(s): "AMMONIA" in the last 168 hours. Coagulation Profile: No results for input(s): "INR", "PROTIME" in the last 168 hours. Cardiac Enzymes: No results for input(s): "CKTOTAL", "CKMB", "CKMBINDEX", "TROPONINI" in the last 168 hours. BNP (last 3 results) No results for input(s): "PROBNP" in the last 8760 hours. HbA1C: No  results for input(s): "HGBA1C" in the last 72 hours. CBG: No results for input(s): "GLUCAP" in the last 168 hours. Lipid Profile: No results for input(s): "CHOL", "HDL", "LDLCALC", "TRIG", "CHOLHDL", "LDLDIRECT" in the last 72 hours. Thyroid Function Tests: No results for input(s): "TSH", "T4TOTAL", "FREET4", "T3FREE", "THYROIDAB" in the last 72 hours. Anemia Panel: No results for input(s): "VITAMINB12", "FOLATE", "FERRITIN", "TIBC", "IRON", "RETICCTPCT" in the last 72 hours. Urine analysis:    Component Value Date/Time   COLORURINE YELLOW (A) 02/26/2022 1134   APPEARANCEUR HAZY (A) 02/26/2022 1134   LABSPEC 1.027 02/26/2022 1134   PHURINE 5.0 02/26/2022 1134   GLUCOSEU NEGATIVE 02/26/2022 1134   HGBUR NEGATIVE 02/26/2022 1134   BILIRUBINUR NEGATIVE 02/26/2022 1134   KETONESUR 20 (A) 02/26/2022 1134   PROTEINUR NEGATIVE 02/26/2022 1134   NITRITE NEGATIVE 02/26/2022 1134   LEUKOCYTESUR NEGATIVE 02/26/2022 1134    Radiological Exams on Admission: CT Abdomen Pelvis W Contrast  Result Date: 02/26/2022 CLINICAL DATA:  Abdominal pain, acute, nonlocalized; Pulmonary embolism (PE) suspected, high prob. Worsening right upper abdominal pain wrapping around to the back with intermittent dizziness and palpitations. EXAM: CT ANGIOGRAPHY CHEST CT ABDOMEN AND PELVIS WITH CONTRAST TECHNIQUE: Multidetector CT imaging of the chest was performed using the standard protocol during bolus administration of intravenous contrast. Multiplanar CT image reconstructions and MIPs were obtained to evaluate the vascular anatomy. Multidetector CT imaging of the abdomen and pelvis was performed using the standard protocol during bolus administration of intravenous contrast. RADIATION DOSE REDUCTION: This exam was performed according to the departmental dose-optimization program which includes automated exposure control, adjustment of the mA and/or kV according to patient size and/or use of iterative reconstruction  technique. CONTRAST:  16m OMNIPAQUE IOHEXOL 350 MG/ML SOLN COMPARISON:  CTA chest 09/17/2021. CT abdomen and pelvis 02/24/2022. FINDINGS: CTA CHEST FINDINGS Cardiovascular: Pulmonary arterial opacification is adequate without evidence of emboli. The main pulmonary  artery remains dilated, measuring 3.7 cm in diameter. The thoracic aorta is normal in caliber. The heart is normal in size. There is no pericardial effusion. Mediastinum/Nodes: No enlarged axillary, mediastinal, or hilar lymph nodes. Unremarkable esophagus and included thyroid. Lungs/Pleura: No pleural effusion or pneumothorax. Unchanged subtle nonspecific mosaic attenuation in the upper lobes. No lung consolidation or mass. Musculoskeletal: No acute osseous abnormality or suspicious osseous lesion. Review of the MIP images confirms the above findings. CT ABDOMEN and PELVIS FINDINGS Hepatobiliary: Hemangiomas are again noted in segments III and VI with the latter measuring 5 cm in size, unchanged. Other smaller hypodense liver lesions are also unchanged and may reflect cysts. There is no significant biliary dilatation status post cholecystectomy. Pancreas: Unremarkable. Spleen: Unremarkable. Adrenals/Urinary Tract: Unremarkable adrenal glands. No evidence of a renal mass, calculi, or hydronephrosis. Nondistended bladder. Stomach/Bowel: The stomach is unremarkable. There is no evidence of bowel obstruction or inflammation. The appendix is unremarkable. Vascular/Lymphatic: Normal caliber of the abdominal aorta. No enlarged lymph nodes. Reproductive: Subcentimeter hypodense focus in the uterine fundus, possibly a tiny fibroid. No adnexal mass. Other: No ascites or pneumoperitoneum. Musculoskeletal: Chronic bilateral L5 pars defects with 7 mm anterolisthesis, moderately advanced L5-S1 disc degeneration, and severe bilateral neural foraminal stenosis. Review of the MIP images confirms the above findings. IMPRESSION: 1. No evidence of pulmonary emboli or other  acute abnormality in the chest. 2. No acute abnormality identified in the abdomen or pelvis. 3. Unchanged hepatic hemangiomas. 4. Unchanged dilatation of the main pulmonary artery which can be seen with pulmonary arterial hypertension. Electronically Signed   By: Logan Bores M.D.   On: 02/26/2022 15:51   CT Angio Chest PE W/Cm &/Or Wo Cm  Result Date: 02/26/2022 CLINICAL DATA:  Abdominal pain, acute, nonlocalized; Pulmonary embolism (PE) suspected, high prob. Worsening right upper abdominal pain wrapping around to the back with intermittent dizziness and palpitations. EXAM: CT ANGIOGRAPHY CHEST CT ABDOMEN AND PELVIS WITH CONTRAST TECHNIQUE: Multidetector CT imaging of the chest was performed using the standard protocol during bolus administration of intravenous contrast. Multiplanar CT image reconstructions and MIPs were obtained to evaluate the vascular anatomy. Multidetector CT imaging of the abdomen and pelvis was performed using the standard protocol during bolus administration of intravenous contrast. RADIATION DOSE REDUCTION: This exam was performed according to the departmental dose-optimization program which includes automated exposure control, adjustment of the mA and/or kV according to patient size and/or use of iterative reconstruction technique. CONTRAST:  75m OMNIPAQUE IOHEXOL 350 MG/ML SOLN COMPARISON:  CTA chest 09/17/2021. CT abdomen and pelvis 02/24/2022. FINDINGS: CTA CHEST FINDINGS Cardiovascular: Pulmonary arterial opacification is adequate without evidence of emboli. The main pulmonary artery remains dilated, measuring 3.7 cm in diameter. The thoracic aorta is normal in caliber. The heart is normal in size. There is no pericardial effusion. Mediastinum/Nodes: No enlarged axillary, mediastinal, or hilar lymph nodes. Unremarkable esophagus and included thyroid. Lungs/Pleura: No pleural effusion or pneumothorax. Unchanged subtle nonspecific mosaic attenuation in the upper lobes. No lung  consolidation or mass. Musculoskeletal: No acute osseous abnormality or suspicious osseous lesion. Review of the MIP images confirms the above findings. CT ABDOMEN and PELVIS FINDINGS Hepatobiliary: Hemangiomas are again noted in segments III and VI with the latter measuring 5 cm in size, unchanged. Other smaller hypodense liver lesions are also unchanged and may reflect cysts. There is no significant biliary dilatation status post cholecystectomy. Pancreas: Unremarkable. Spleen: Unremarkable. Adrenals/Urinary Tract: Unremarkable adrenal glands. No evidence of a renal mass, calculi, or hydronephrosis. Nondistended bladder. Stomach/Bowel: The  stomach is unremarkable. There is no evidence of bowel obstruction or inflammation. The appendix is unremarkable. Vascular/Lymphatic: Normal caliber of the abdominal aorta. No enlarged lymph nodes. Reproductive: Subcentimeter hypodense focus in the uterine fundus, possibly a tiny fibroid. No adnexal mass. Other: No ascites or pneumoperitoneum. Musculoskeletal: Chronic bilateral L5 pars defects with 7 mm anterolisthesis, moderately advanced L5-S1 disc degeneration, and severe bilateral neural foraminal stenosis. Review of the MIP images confirms the above findings. IMPRESSION: 1. No evidence of pulmonary emboli or other acute abnormality in the chest. 2. No acute abnormality identified in the abdomen or pelvis. 3. Unchanged hepatic hemangiomas. 4. Unchanged dilatation of the main pulmonary artery which can be seen with pulmonary arterial hypertension. Electronically Signed   By: Logan Bores M.D.   On: 02/26/2022 15:51    EKG: Independently reviewed.   Assessment/Plan Principal Problem:   Chest pain Active Problems:   GERD (gastroesophageal reflux disease)   IBS (irritable bowel syndrome)   Insomnia   Seasonal allergies  Chest pain: w/ minimal troponin elevation. Continue on tele. Cardio consulted, Dr. Saralyn Pilar by ER physician. Will likely have cardiac stress test  tomorrow. NPO after midnight. Nitro, morphine prn. Intermittently takes NSAIDs.   GERD: continue on PPI, carafate  IBS-M: not on any meds as per med rec  Insomnia: melatonin, xanax prn   Seasonal allergies: continue on home dose of zyrtec   DVT prophylaxis: lovenox  Code Status: full  Family Communication:  Disposition Plan:  likely d/c back home  Consults called: cardio, Dr. Saralyn Pilar (called by ER physician, Dr. Charna Archer)  Admission status: observation    Wyvonnia Dusky MD [facility]   If 7PM-7AM, please contact night-coverage [email or web address]   02/26/2022, 5:02 PM

## 2022-02-26 NOTE — ED Provider Notes (Signed)
[facility] Provider Note    Event Date/Time   First MD Initiated Contact with Patient 02/26/22 1306     (approximate)   History   Chief Complaint Abdominal Pain  HPI  Jennifer Mclaughlin is a 51 y.o. female with past medical history of hypertension, anemia, and cholecystectomy who presents to the ED complaining of abdominal pain.  Patient reports that she has had 4 days of constant pain in her epigastric and right upper quadrant area that seems to wax and wane in severity but is not exacerbated or alleviated by anything in particular.  She describes the pain as sharp and it has begun radiating towards the middle of her back over the past 2 days.  She states that it will sometimes move upwards into her chest and she has had a sensation of "fluttering" in her chest at times.  She denies any fevers, cough, or difficulty breathing.  She has not had any nausea, vomiting, or diarrhea.  She denies any dysuria, hematuria, or flank pain.  She describes symptoms as similar to when she had cholecystitis, but has since had her gallbladder taken out.     Physical Exam   Triage Vital Signs: ED Triage Vitals  Enc Vitals Group     BP 02/26/22 1138 (!) 129/97     Pulse Rate 02/26/22 1138 83     Resp 02/26/22 1138 16     Temp 02/26/22 1138 98.4 F (36.9 C)     Temp Source 02/26/22 1138 Oral     SpO2 02/26/22 1138 95 %     Weight 02/26/22 1133 175 lb (79.4 kg)     Height 02/26/22 1133 '5\' 3"'$  (1.6 m)     Head Circumference --      Peak Flow --      Pain Score 02/26/22 1132 8     Pain Loc --      Pain Edu? --      Excl. in [place]? --     Most recent vital signs: Vitals:   02/26/22 1138 02/26/22 1457  BP: (!) 129/97 (!) 130/98  Pulse: 83 80  Resp: 16 17  Temp: 98.4 F (36.9 C)   SpO2: 95% 100%    Constitutional: Alert and oriented. Eyes: Conjunctivae are normal. Head: Atraumatic. Nose: No congestion/rhinnorhea. Mouth/Throat: Mucous membranes are moist.   Cardiovascular: Normal rate, regular rhythm. Grossly normal heart sounds.  2+ radial pulses bilaterally. Respiratory: Normal respiratory effort.  No retractions. Lungs CTAB.  No chest wall tenderness to palpation. Gastrointestinal: Soft and tender to palpation in her epigastrium and right upper quadrant with no rebound or guarding. No distention.  No CVA tenderness bilaterally. Musculoskeletal: No lower extremity tenderness nor edema.  Neurologic:  Normal speech and language. No gross focal neurologic deficits are appreciated.    ED Results / Procedures / Treatments   Labs (all labs ordered are listed, but only abnormal results are displayed) Labs Reviewed  COMPREHENSIVE METABOLIC PANEL - Abnormal; Notable for the following components:      Result Value   Glucose, Bld 108 (*)    AST 42 (*)    All other components within normal limits  CBC - Abnormal; Notable for the following components:   RBC 5.29 (*)    Hemoglobin 15.4 (*)    HCT 47.1 (*)    All other components within normal limits  URINALYSIS, ROUTINE W REFLEX MICROSCOPIC - Abnormal; Notable for the following components:   Color, Urine YELLOW (*)  APPearance HAZY (*)    Ketones, ur 20 (*)    All other components within normal limits  TROPONIN I (HIGH SENSITIVITY) - Abnormal; Notable for the following components:   Troponin I (High Sensitivity) 48 (*)    All other components within normal limits  TROPONIN I (HIGH SENSITIVITY) - Abnormal; Notable for the following components:   Troponin I (High Sensitivity) 49 (*)    All other components within normal limits  LIPASE, BLOOD  PREGNANCY, URINE     EKG  ED ECG REPORT I, Blake Divine, the attending physician, personally viewed and interpreted this ECG.   Date: 02/26/2022  EKG Time: 11:31  Rate: 71  Rhythm: normal sinus rhythm  Axis: RAD  Intervals:none  ST&T Change: Subtle ST elevation in aVL with < 53m ST depressions inferiorly  RADIOLOGY CTA chest reviewed  and interpreted by me with no pulmonary embolism or focal infiltrate.  PROCEDURES:  Critical Care performed: No  Procedures   MEDICATIONS ORDERED IN ED: Medications  aspirin chewable tablet 324 mg (324 mg Oral Given 02/26/22 1406)  iohexol (OMNIPAQUE) 350 MG/ML injection 100 mL (75 mLs Intravenous Contrast Given 02/26/22 1518)     IMPRESSION / MDM / [place]/ ED COURSE  I reviewed the triage vital signs and the nursing notes.                              51y.o. female with past medical history of hypertension, anemia, and cholecystectomy who presents to the ED with 4 days of pain in her epigastrium and right upper quadrant radiating towards her back and up into her chest.  Patient's presentation is most consistent with acute presentation with potential threat to life or bodily function.  Differential diagnosis includes, but is not limited to, ACS, PE, pneumonia, pneumothorax, dissection, pancreatitis, hepatitis, gastritis.  Patient nontoxic-appearing and in no acute distress, vital signs are unremarkable.  EKG is concerning for subtle less than 1 mm ST elevation in aVL with additional subtle depressions in the inferior leads.  While symptoms are atypical for ACS, appearance of EKG is concerning and troponin is again mildly elevated, similar to ED visit 3 days ago.  We will continue to trend troponin, further assess with CTA to rule out PE, repeat CT of her abdomen/pelvis given her abdominal tenderness.  Additional labs are reassuring with no significant anemia, leukocytosis, electrolyte abnormality, or AKI.  LFTs are unremarkable and urinalysis shows no signs of infection.   Repeat troponin is stable compared to initial, CTA chest is negative for PE or other acute process, CT of abdomen/pelvis is also unremarkable.  Case discussed with Dr. PSaralyn Pilarof cardiology, who recommends admission for stress testing in the morning given abnormal EKG with mildly elevated troponin.   Given her atypical symptoms and stable troponin, we will hold off on heparin, but she was given loading dose of aspirin.  Case discussed with hospitalist for admission.      FINAL CLINICAL IMPRESSION(S) / ED DIAGNOSES   Final diagnoses:  Epigastric pain  Abnormal EKG     Rx / DC Orders   ED Discharge Orders     None        Note:  This document was prepared using Dragon voice recognition software and may include unintentional dictation errors.   JBlake Divine MD 02/26/22 [phone]

## 2022-02-27 ENCOUNTER — Observation Stay: Payer: 59

## 2022-02-27 ENCOUNTER — Encounter: Payer: Self-pay | Admitting: Internal Medicine

## 2022-02-27 DIAGNOSIS — R7989 Other specified abnormal findings of blood chemistry: Secondary | ICD-10-CM | POA: Diagnosis not present

## 2022-02-27 DIAGNOSIS — E876 Hypokalemia: Secondary | ICD-10-CM | POA: Diagnosis not present

## 2022-02-27 DIAGNOSIS — R0789 Other chest pain: Secondary | ICD-10-CM | POA: Diagnosis not present

## 2022-02-27 DIAGNOSIS — K219 Gastro-esophageal reflux disease without esophagitis: Secondary | ICD-10-CM | POA: Diagnosis not present

## 2022-02-27 DIAGNOSIS — K582 Mixed irritable bowel syndrome: Secondary | ICD-10-CM | POA: Diagnosis not present

## 2022-02-27 DIAGNOSIS — R079 Chest pain, unspecified: Secondary | ICD-10-CM | POA: Diagnosis not present

## 2022-02-27 LAB — BASIC METABOLIC PANEL
Anion gap: 9 (ref 5–15)
BUN: 17 mg/dL (ref 6–20)
CO2: 24 mmol/L (ref 22–32)
Calcium: 9.4 mg/dL (ref 8.9–10.3)
Chloride: 107 mmol/L (ref 98–111)
Creatinine, Ser: 0.76 mg/dL (ref 0.44–1.00)
GFR, Estimated: >60 mL/min (ref >60)
Glucose, Bld: 109 mg/dL — ABNORMAL HIGH (ref 70–99)
Potassium: 3.4 mmol/L — ABNORMAL LOW (ref 3.5–5.1)
Sodium: 140 mmol/L (ref 135–145)

## 2022-02-27 LAB — NM MYOCAR MULTI W/SPECT W/WALL MOTION / EF
Estimated workload: 1
Exercise duration (min): 1 min
Exercise duration (sec): 4 s
LV dias vol: 50 mL (ref 46–106)
LV sys vol: 13 mL
MPHR: 172 {beats}/min
Nuc Stress EF: 74 %
Peak HR: 105 {beats}/min
Percent HR: 61 %
Rest HR: 56 {beats}/min
Rest Nuclear Isotope Dose: 10.6 mCi
SDS: 1
SRS: 0
SSS: 1
ST Depression (mm): 0 mm
Stress Nuclear Isotope Dose: 30.5 mCi
TID: 0.89

## 2022-02-27 LAB — CBC
HCT: 44.7 % (ref 36.0–46.0)
Hemoglobin: 14.8 g/dL (ref 12.0–15.0)
MCH: 29.7 pg (ref 26.0–34.0)
MCHC: 33.1 g/dL (ref 30.0–36.0)
MCV: 89.8 fL (ref 80.0–100.0)
Platelets: 290 10*3/uL (ref 150–400)
RBC: 4.98 MIL/uL (ref 3.87–5.11)
RDW: 12.8 % (ref 11.5–15.5)
WBC: 7.9 10*3/uL (ref 4.0–10.5)
nRBC: 0 % (ref 0.0–0.2)

## 2022-02-27 LAB — HIV ANTIBODY (ROUTINE TESTING W REFLEX): HIV Screen 4th Generation wRfx: NONREACTIVE

## 2022-02-27 LAB — CBG MONITORING, ED: Glucose-Capillary: 105 mg/dL — ABNORMAL HIGH (ref 70–99)

## 2022-02-27 MED ORDER — POTASSIUM CHLORIDE CRYS ER 20 MEQ PO TBCR
20.0000 meq | EXTENDED_RELEASE_TABLET | Freq: Once | ORAL | Status: AC
  Administered 2022-02-27: 20 meq via ORAL
  Filled 2022-02-27 (×2): qty 1

## 2022-02-27 MED ORDER — REGADENOSON 0.4 MG/5ML IV SOLN
0.4000 mg | Freq: Once | INTRAVENOUS | Status: AC
Start: 2022-02-27 — End: 2022-02-27
  Administered 2022-02-27: 0.4 mg via INTRAVENOUS

## 2022-02-27 MED ORDER — TECHNETIUM TC 99M TETROFOSMIN IV KIT
10.0000 | PACK | Freq: Once | INTRAVENOUS | Status: AC
  Administered 2022-02-27: 10.59 via INTRAVENOUS

## 2022-02-27 MED ORDER — ASPIRIN 81 MG PO CHEW: 81.0000 mg | CHEWABLE_TABLET | Freq: Every day | ORAL | 0 refills | Status: AC

## 2022-02-27 MED ORDER — ASPIRIN 81 MG PO CHEW: 81.0000 mg | CHEWABLE_TABLET | Freq: Every day | ORAL | Status: DC

## 2022-02-27 MED ORDER — TECHNETIUM TC 99M TETROFOSMIN IV KIT
30.4800 | PACK | Freq: Once | INTRAVENOUS | Status: AC | PRN
  Administered 2022-02-27: 30.48 via INTRAVENOUS

## 2022-02-27 NOTE — ED Notes (Signed)
Observed O2 stats ranging from 90 to 91 when placing pt back onto the monitor after using the restroom. RN was notified

## 2022-02-27 NOTE — ED Notes (Signed)
Patient states that her previous symptoms of feeling flushed and hot, headache, dizziness, and not feeling right have resolved for now

## 2022-02-27 NOTE — ED Notes (Signed)
Patient states that she asked the previous nurse to remove her IV because it hurt. I educated patient that with her symptoms, it would be much safer to have IV access, and patient gave me permission to insert a new IV

## 2022-02-27 NOTE — Consult Note (Cosign Needed Addendum)
[place] NOTE       Patient ID: Jennifer Mclaughlin MRN: [id number] DOB/AGE: 49-22-1972 49 y.o.  Admit date: 02/26/2022 Referring Physician Dr. Blake Divine Primary Physician Dr. Conley Simmonds Primary Cardiologist Dr. Saralyn Pilar Reason for Consultation abnormal EKG  HPI: Jennifer Mclaughlin is a 51yoF with a PMH of hypertension, hypothyroidism, anxiety, history of tobacco use who presented to [facility] ED 02/26/2022 with abdominal pain and 2 short episodes of chest pain.  EKG performed in the ED was abnormal thus cardiology was consulted.  The patient states 6 days ago she started hurting in her left side and it radiated all the way to her back.  She then started feeling some epigastric pain that she describes as a soreness "like someone had been punching it" and she has not had a great appetite all week.  She says the pain also travels to her right upper quadrant and is generally sore all over her upper abdomen similar to what she had before her cholecystectomy.  She has not taken anything that makes it feels better, has more discomfort when moving around.  She notes yesterday she had 2 short episodes of fluttering in her chest and while sitting in the ER she noted a few minutes of left-sided chest pressure that was in her arm that terminated without intervention.  She saw Dr. Saralyn Pilar earlier this year for some chest pain in the setting of taking minoxidil and spironolactone that resolved with discontinuation of the medicines.  She is unsure of this chest fluttering and left-sided chest pressure were similar to what she experienced in the past.  Her main complaint this morning is a persistent epigastric soreness and tenderness to palpation and denies further chest discomfort since yesterday afternoon sitting in the ER.  She denies any nausea, vomiting, diarrhea, melena, hematochezia.  She has had some intermittent headaches.  Vitals are notable for a blood pressure of 109/85, heart  rate 47 in sinus bradycardia on telemetry without evidence of sinus pauses.  Her labs are notable for a potassium of 3.4, BUN/creatinine 17/0.76, GFR greater than 60.  High-sensitivity troponin minimally elevated and flat trending 37-48-49, interestingly higher than it has ever been.  H&H 14.8/44.7.  Platelets 290.  She has a strong family history of heart disease, with CAD in her paternal uncle and father, both with reported blockages her paternal aunt with atrial fibrillation.  She has a previous smoking history about 2 cigarettes a day for a total of 2 years.  Drinks alcohol infrequently.  She thinks she had a treadmill stress test years ago at [facility] clinic, is unsure of results and I am unable to locate this fortunately.  Her EKG does show sinus bradycardia with some slight inferolateral T wave flattening new compared to prior from January of this year  CT a chest and CT abdomen pelvis without evidence of pulmonary embolus or acute abnormality in the chest, abdomen or pelvis.  She has unchanged hepatic hemangiomas with unchanged dilatation of the main pulmonary artery that can be seen in pulmonary artery hypertension.  She has had serial CTs on 6/25 and 6/27 prior to this admission.  Review of systems complete and found to be negative unless listed above     Past Medical History:  Diagnosis Date   Anemia    Hypertension    Hypothyroidism     Past Surgical History:  Procedure Laterality Date   CHOLECYSTECTOMY     NASAL SINUS SURGERY     TONSILLECTOMY  TUBAL LIGATION      (Not in a hospital admission)  Social History   Socioeconomic History   Marital status: Single    Spouse name: Not on file   Number of children: Not on file   Years of education: Not on file   Highest education level: Not on file  Occupational History   Not on file  Tobacco Use   Smoking status: Former   Smokeless tobacco: Never  Substance and Sexual Activity   Alcohol use: Yes    Alcohol/week:  0.0 standard drinks of alcohol    Comment: occas   Drug use: No   Sexual activity: Not on file  Other Topics Concern   Not on file  Social History Narrative   Not on file   Social Determinants of Health   Financial Resource Strain: Not on file  Food Insecurity: Not on file  Transportation Needs: Not on file  Physical Activity: Not on file  Stress: Not on file  Social Connections: Not on file  Intimate Partner Violence: Not on file    Family History  Problem Relation Age of Onset   Thyroid cancer Father    Breast cancer Neg Hx       PHYSICAL EXAM General: 23 Caucasian female, well nourished, in no acute distress.  Laying at incline in ED bed HEENT:  Normocephalic and atraumatic. Neck:  No JVD.  Lungs: Normal respiratory effort on oxygen by nasal cannula. Clear bilaterally to auscultation. No wheezes, crackles, rhonchi.  Heart: HRRR . Normal S1 and S2 without gallops or murmurs. Radial & DP pulses 2+ bilaterally. Abdomen: Soft, tender to palpation in the epigastrium, right and left upper quadrant without rebound or guarding. Msk: Normal strength and tone for age. Extremities: Warm and well perfused. No clubbing, cyanosis.  No peripheral edema.  Neuro: Alert and oriented X 3. Psych:  Answers questions appropriately.   Labs:   Lab Results  Component Value Date   WBC 7.9 02/27/2022   HGB 14.8 02/27/2022   HCT 44.7 02/27/2022   MCV 89.8 02/27/2022   PLT 290 02/27/2022    Recent Labs  Lab 02/26/22 1135 02/27/22 0341  NA 141 140  K 3.8 3.4*  CL 109 107  CO2 23 24  BUN 16 17  CREATININE 0.73 0.76  CALCIUM 10.0 9.4  PROT 7.7  --   BILITOT 0.9  --   ALKPHOS 50  --   ALT 18  --   AST 42*  --   GLUCOSE 108* 109*   Lab Results  Component Value Date   TROPONINI <0.03 09/06/2015   No results found for: "CHOL" No results found for: "HDL" No results found for: "LDLCALC" No results found for: "TRIG" No results found for: "CHOLHDL" No results found for:  "LDLDIRECT"    Radiology: CT Abdomen Pelvis W Contrast  Result Date: 02/26/2022 CLINICAL DATA:  Abdominal pain, acute, nonlocalized; Pulmonary embolism (PE) suspected, high prob. Worsening right upper abdominal pain wrapping around to the back with intermittent dizziness and palpitations. EXAM: CT ANGIOGRAPHY CHEST CT ABDOMEN AND PELVIS WITH CONTRAST TECHNIQUE: Multidetector CT imaging of the chest was performed using the standard protocol during bolus administration of intravenous contrast. Multiplanar CT image reconstructions and MIPs were obtained to evaluate the vascular anatomy. Multidetector CT imaging of the abdomen and pelvis was performed using the standard protocol during bolus administration of intravenous contrast. RADIATION DOSE REDUCTION: This exam was performed according to the departmental dose-optimization program which includes automated exposure control, adjustment  of the mA and/or kV according to patient size and/or use of iterative reconstruction technique. CONTRAST:  84m OMNIPAQUE IOHEXOL 350 MG/ML SOLN COMPARISON:  CTA chest 09/17/2021. CT abdomen and pelvis 02/24/2022. FINDINGS: CTA CHEST FINDINGS Cardiovascular: Pulmonary arterial opacification is adequate without evidence of emboli. The main pulmonary artery remains dilated, measuring 3.7 cm in diameter. The thoracic aorta is normal in caliber. The heart is normal in size. There is no pericardial effusion. Mediastinum/Nodes: No enlarged axillary, mediastinal, or hilar lymph nodes. Unremarkable esophagus and included thyroid. Lungs/Pleura: No pleural effusion or pneumothorax. Unchanged subtle nonspecific mosaic attenuation in the upper lobes. No lung consolidation or mass. Musculoskeletal: No acute osseous abnormality or suspicious osseous lesion. Review of the MIP images confirms the above findings. CT ABDOMEN and PELVIS FINDINGS Hepatobiliary: Hemangiomas are again noted in segments III and VI with the latter measuring 5 cm in size,  unchanged. Other smaller hypodense liver lesions are also unchanged and may reflect cysts. There is no significant biliary dilatation status post cholecystectomy. Pancreas: Unremarkable. Spleen: Unremarkable. Adrenals/Urinary Tract: Unremarkable adrenal glands. No evidence of a renal mass, calculi, or hydronephrosis. Nondistended bladder. Stomach/Bowel: The stomach is unremarkable. There is no evidence of bowel obstruction or inflammation. The appendix is unremarkable. Vascular/Lymphatic: Normal caliber of the abdominal aorta. No enlarged lymph nodes. Reproductive: Subcentimeter hypodense focus in the uterine fundus, possibly a tiny fibroid. No adnexal mass. Other: No ascites or pneumoperitoneum. Musculoskeletal: Chronic bilateral L5 pars defects with 7 mm anterolisthesis, moderately advanced L5-S1 disc degeneration, and severe bilateral neural foraminal stenosis. Review of the MIP images confirms the above findings. IMPRESSION: 1. No evidence of pulmonary emboli or other acute abnormality in the chest. 2. No acute abnormality identified in the abdomen or pelvis. 3. Unchanged hepatic hemangiomas. 4. Unchanged dilatation of the main pulmonary artery which can be seen with pulmonary arterial hypertension. Electronically Signed   By: ALogan BoresM.D.   On: 02/26/2022 15:51   CT Angio Chest PE W/Cm &/Or Wo Cm  Result Date: 02/26/2022 CLINICAL DATA:  Abdominal pain, acute, nonlocalized; Pulmonary embolism (PE) suspected, high prob. Worsening right upper abdominal pain wrapping around to the back with intermittent dizziness and palpitations. EXAM: CT ANGIOGRAPHY CHEST CT ABDOMEN AND PELVIS WITH CONTRAST TECHNIQUE: Multidetector CT imaging of the chest was performed using the standard protocol during bolus administration of intravenous contrast. Multiplanar CT image reconstructions and MIPs were obtained to evaluate the vascular anatomy. Multidetector CT imaging of the abdomen and pelvis was performed using the  standard protocol during bolus administration of intravenous contrast. RADIATION DOSE REDUCTION: This exam was performed according to the departmental dose-optimization program which includes automated exposure control, adjustment of the mA and/or kV according to patient size and/or use of iterative reconstruction technique. CONTRAST:  783mOMNIPAQUE IOHEXOL 350 MG/ML SOLN COMPARISON:  CTA chest 09/17/2021. CT abdomen and pelvis 02/24/2022. FINDINGS: CTA CHEST FINDINGS Cardiovascular: Pulmonary arterial opacification is adequate without evidence of emboli. The main pulmonary artery remains dilated, measuring 3.7 cm in diameter. The thoracic aorta is normal in caliber. The heart is normal in size. There is no pericardial effusion. Mediastinum/Nodes: No enlarged axillary, mediastinal, or hilar lymph nodes. Unremarkable esophagus and included thyroid. Lungs/Pleura: No pleural effusion or pneumothorax. Unchanged subtle nonspecific mosaic attenuation in the upper lobes. No lung consolidation or mass. Musculoskeletal: No acute osseous abnormality or suspicious osseous lesion. Review of the MIP images confirms the above findings. CT ABDOMEN and PELVIS FINDINGS Hepatobiliary: Hemangiomas are again noted in segments III and VI with the  latter measuring 5 cm in size, unchanged. Other smaller hypodense liver lesions are also unchanged and may reflect cysts. There is no significant biliary dilatation status post cholecystectomy. Pancreas: Unremarkable. Spleen: Unremarkable. Adrenals/Urinary Tract: Unremarkable adrenal glands. No evidence of a renal mass, calculi, or hydronephrosis. Nondistended bladder. Stomach/Bowel: The stomach is unremarkable. There is no evidence of bowel obstruction or inflammation. The appendix is unremarkable. Vascular/Lymphatic: Normal caliber of the abdominal aorta. No enlarged lymph nodes. Reproductive: Subcentimeter hypodense focus in the uterine fundus, possibly a tiny fibroid. No adnexal mass.  Other: No ascites or pneumoperitoneum. Musculoskeletal: Chronic bilateral L5 pars defects with 7 mm anterolisthesis, moderately advanced L5-S1 disc degeneration, and severe bilateral neural foraminal stenosis. Review of the MIP images confirms the above findings. IMPRESSION: 1. No evidence of pulmonary emboli or other acute abnormality in the chest. 2. No acute abnormality identified in the abdomen or pelvis. 3. Unchanged hepatic hemangiomas. 4. Unchanged dilatation of the main pulmonary artery which can be seen with pulmonary arterial hypertension. Electronically Signed   By: Logan Bores M.D.   On: 02/26/2022 15:51   CT ABDOMEN PELVIS W CONTRAST  Result Date: 02/25/2022 CLINICAL DATA:  Bilateral lower abdominal pain for months. EXAM: CT ABDOMEN AND PELVIS WITH CONTRAST TECHNIQUE: Multidetector CT imaging of the abdomen and pelvis was performed using the standard protocol following bolus administration of intravenous contrast. RADIATION DOSE REDUCTION: This exam was performed according to the departmental dose-optimization program which includes automated exposure control, adjustment of the mA and/or kV according to patient size and/or use of iterative reconstruction technique. CONTRAST:  162m ISOVUE-300 IOPAMIDOL (ISOVUE-300) INJECTION 61% COMPARISON:  02/22/2022 FINDINGS: Lower chest: Lung bases are clear. Hepatobiliary: Circumscribed low-attenuation lesions demonstrated in segment 3 and segment 6 of the liver. Lesions demonstrate peripheral nodular enhancement pattern characteristic of cavernous hemangioma. No change since prior study. Surgical absence of the gallbladder. No bile duct dilatation. Pancreas: Unremarkable. No pancreatic ductal dilatation or surrounding inflammatory changes. Spleen: Normal in size without focal abnormality. Adrenals/Urinary Tract: Adrenal glands are unremarkable. Kidneys are normal, without renal calculi, focal lesion, or hydronephrosis. Bladder is unremarkable. Stomach/Bowel:  Stomach is within normal limits. Appendix appears normal. No evidence of bowel wall thickening, distention, or inflammatory changes. Vascular/Lymphatic: Aortic atherosclerosis. No enlarged abdominal or pelvic lymph nodes. Reproductive: Uterus and bilateral adnexa are unremarkable. Other: No abdominal wall hernia or abnormality. No abdominopelvic ascites. Musculoskeletal: Spondylolysis with mild spondylolisthesis at L5-S1. IMPRESSION: 1. Stable appearance of hepatic cavernous hemangiomas. No follow-up is indicated. 2. No evidence of bowel obstruction or inflammation. 3. Mild aortic atherosclerosis. 4. Spondylolysis with mild spondylolisthesis at L5-S1. 5. No significant change since previous study. Electronically Signed   By: WLucienne CapersM.D.   On: 02/25/2022 20:15   CT ABDOMEN PELVIS W CONTRAST  Result Date: 02/22/2022 CLINICAL DATA:  Right-sided abdominal pain EXAM: CT ABDOMEN AND PELVIS WITH CONTRAST TECHNIQUE: Multidetector CT imaging of the abdomen and pelvis was performed using the standard protocol following bolus administration of intravenous contrast. RADIATION DOSE REDUCTION: This exam was performed according to the departmental dose-optimization program which includes automated exposure control, adjustment of the mA and/or kV according to patient size and/or use of iterative reconstruction technique. CONTRAST:  748mOMNIPAQUE IOHEXOL 350 MG/ML SOLN COMPARISON:  06/16/21 FINDINGS: Lower chest: No acute abnormality. Hepatobiliary: Gallbladder has been surgically removed. A peripherally enhancing lesion is noted within the posterior right lobe of the liver measuring up to 5.2 cm consistent with hemangioma. No other focal mass is seen. Mild fullness of the  biliary ductal dilatation is seen consistent with the post cholecystectomy state. Pancreas: Unremarkable. No pancreatic ductal dilatation or surrounding inflammatory changes. Spleen: Normal in size without focal abnormality. Adrenals/Urinary Tract:  Adrenal glands are within normal limits. Kidneys demonstrate a normal enhancement pattern. No renal calculi or obstructive changes are seen. Bladder is within normal limits. Stomach/Bowel: The appendix is within normal limits. No obstructive or inflammatory changes of the colon are seen. Small bowel and stomach are unremarkable. Vascular/Lymphatic: No significant vascular findings are present. No enlarged abdominal or pelvic lymph nodes. Reproductive: Uterus and bilateral adnexa are unremarkable. Other: No abdominal wall hernia or abnormality. No abdominopelvic ascites. Musculoskeletal: No acute or significant osseous findings. IMPRESSION: Normal-appearing appendix. Status post cholecystectomy. Right hepatic hemangioma stable from previous exams. Electronically Signed   By: Inez Catalina M.D.   On: 02/22/2022 21:19    ECHO 10/01/2021 ECHOCARDIOGRAPHIC MEASUREMENTS  2D DIMENSIONS  AORTA                  Values   Normal Range   MAIN PA         Values    Normal Range                Annulus: 1.7 cm       [2.1-2.5]         PA Main: nm*       [1.5-2.1]              Aorta Sin: nm*          [2.7-3.3]    RIGHT VENTRICLE            ST Junction: nm*          [2.3-2.9]         RV Base: 3.8 cm    [<4.2]              Asc.Aorta: 2.6 cm       [2.3-3.1]          RV Mid: 3.6 cm    [< 3.5]  LEFT VENTRICLE                                      RV Length: nm*       [<8.6]                  LVIDd: 4.0 cm       [3.9-5.3]    INFERIOR VENA CAVA                  LVIDs: 2.7 cm                        Max. IVC: nm*       [<=2.1]                     FS: 32.9 %       [>25]            Min. IVC: nm*                    SWT: 1.1 cm       [0.5-0.9]    ------------------                    PWT: 1.0 cm       [0.5-0.9]    nm* - not measured  LEFT ATRIUM                LA Diam: 4.0 cm       [2.7-3.8]            LA A4C Area: nm*          [<20]              LA Volume: nm*          [22-52]   _________________________________________________________________________________________  ECHOCARDIOGRAPHIC DESCRIPTIONS  AORTIC ROOT                   Size: Normal             Dissection: INDETERM FOR DISSECTION  AORTIC VALVE               Leaflets: Tricuspid                   Morphology: MILDLY THICKENED               Mobility: Fully mobile  LEFT VENTRICLE                   Size: Normal                        Anterior: Normal            Contraction: Normal                         Lateral: Normal             Closest EF: >55% (Estimated)                Septal: Normal              LV Masses: No Masses                       Apical: Normal                    LVH: None                          Inferior: Normal                                                      Posterior: Normal           Dias.FxClass: (Grade 2) relaxation abnormal, pseudonormal  MITRAL VALVE               Leaflets: Normal                        Mobility: Fully mobile             Morphology: Normal  LEFT ATRIUM                   Size: Normal                       LA Masses: No masses              IA Septum: Normal IAS  MAIN PA  Size: Normal  PULMONIC VALVE             Morphology: Normal                        Mobility: Fully mobile  RIGHT VENTRICLE              RV Masses: No Masses                         Size: MILDLY ENLARGED              Free Wall: Normal                     Contraction: MOD GLOBAL DECREASE  TRICUSPID VALVE               Leaflets: Normal                        Mobility: Fully mobile             Morphology: Normal  RIGHT ATRIUM                   Size: Normal                        RA Other: None                RA Mass: No masses  PERICARDIUM                  Fluid: No effusion  INFERIOR VENACAVA                   Size: Normal Normal respiratory collapse  _________________________________________________________________________________________   DOPPLER ECHO and OTHER SPECIAL  PROCEDURES                 Aortic: TRIVIAL AR                 No AS                         131.0 cm/sec peak vel      6.9 mmHg peak grad                 Mitral: TRIVIAL MR                 No MS                         MV Inflow E Vel = 62.9 cm/sec     MV Annulus E'Vel = 6.2 cm/sec                         E/E'Ratio = 10.1              Tricuspid: TRIVIAL TR                 No TS                         251.0 cm/sec peak TR vel   28.2 mmHg peak RV pressure              Pulmonary: TRIVIAL PR                 No  PS                         103.0 cm/sec peak vel      4.2 mmHg peak grad  _________________________________________________________________________________________  INTERPRETATION  NORMAL LEFT VENTRICULAR SYSTOLIC FUNCTION  MODERATE RV SYSTOLIC DYSFUNCTION (See above)  TRIVIAL REGURGITATION NOTED (See above)  NO VALVULAR STENOSIS  TRIVIAL AR, MR, TR ,PR  EF >55%   TELEMETRY reviewed by me: Sinus bradycardia with rates 45-50s without evidence of sinus pauses or AV block.  EKG reviewed by me: sinus bradycardia with some slight inferolateral T wave flattening new compared to prior from January of this year  ASSESSMENT AND PLAN:  Jennifer Mclaughlin is a 74yoF with a PMH of hypertension, hypothyroidism, anxiety, history of tobacco use who presented [facility]/26/2022 with abdominal pain and 2 short episodes of chest pain.  EKG performed in the ED was abnormal thus cardiology was consulted.  #Epigastric pain #Atypical chest pain #Elevated troponin The patient presents with 6-day history of intermittent primarily epigastric pain and soreness with radiation to both sides of her upper abdomen into her back relieved by nothing in particular, slightly worsened with walking.  She has a few transient episodes of heart fluttering and 2 occasions of left sided chest pressure that radiated to her arm while sitting in the waiting room yesterday that lasted a few minutes and terminated on its own, and  unsure if this is similar to what she was experiencing earlier this year when she saw Dr. Saralyn Pilar for chest pain in the setting of taking minoxidil and spironolactone for hair loss.  Her complaint is mostly epigastric soreness has not had recurrence of her chest discomfort overnight.  Her EKG is slightly abnormal with inferolateral T wave flattening compared to January, and has a strong family history of coronary artery disease in her father and troponin is slightly abnormal, imaging of her chest and abdomen are negative for any acute abnormality. -S/p 325 mg aspirin, continue 81 mg aspirin daily -Defer heparin drip -Continuous monitoring on telemetry - Lexiscan Myoview performed and read as low risk, without evidence of ischemia. She is ok for discharge from a cardiac standpoint and will need follow up with Dr. Saralyn Pilar in 1-2 weeks. Can consider outpatient coronary CTA if refractory symptoms.   This patient's plan of care was discussed and created with D. Paraschos and he is in agreement.  Signed: Tristan Schroeder , PA-C 02/27/2022, 7:56 AM [facility]

## 2022-02-27 NOTE — ED Notes (Signed)
Patient is C/O headache, dizziness, and states she just doesn't feel right. She also states that she feels hot and flushed, but is afebrile. Patient's O2 saturation was in the high 80's when patient came back from ambulating to the bathroom. Notified Sharion Settler, NP

## 2022-02-27 NOTE — ED Notes (Signed)
Notified Sharion Settler, NP

## 2022-02-27 NOTE — TOC CM/SW Note (Signed)
Patient has orders to discharge home today. Chart reviewed. PCP is Jeffrey Sparks, MD. On room air. No wounds. No TOC needs identified. CSW signing off.  Coran Dipaola, CSW [phone]  

## 2022-02-27 NOTE — ED Notes (Signed)
Patient's last BP was in the 73'A systolic. Once she was repositioned, patient's BP came up to 256 systolic

## 2022-02-27 NOTE — Discharge Summary (Signed)
Physician Discharge Summary  Jennifer Mclaughlin NOM:[id number] DOB: [birth year] DOA: 02/26/2022  PCP: Jennifer Crouch, MD  Admit date: 02/26/2022 Discharge date: 02/27/2022  Admitted From: home  Disposition: home   Recommendations for Outpatient Follow-up:  Follow up with PCP in 1-2 weeks F/u w/ cardio, Dr. Saralyn Pilar, in 1-2 weeks   Home Health: no  Equipment/Devices:  Discharge Condition: stable  CODE STATUS: full Diet recommendation: Heart Healthy  Brief/Interim Summary: 51 y/o F w/ PMH of GERD, IBS-M who presents w/ intermittent chest pain x 1 week. The chest pain is dull, intermittent w/ radiation down left arm. The severity is 7/10. Sitting makes the pain worse and carafate makes the pain better. Pt denies any tenderness to palpation of chest. Pt does intermittently takes NSAIDs but not daily. An echo was done earlier this year which was normal as per pt. Pt denies any personal hx of heart disease. Pt's cardiologist is Dr. Saralyn Pilar. Pt denies any fever, chills, sweating, shortness of breath, nausea, vomiting, dysuria, urinary urgency, diarrhea or constipation. Pt does c/o intermittent epigastric pain as well.   Cardio was consulted and recommended a cardiac stress test. Cardiac stress test was normal. Pt was d/c home on baby aspirin as per cardio. Pt will f/u w/ cardio, Dr. Saralyn Pilar in 1-2 weeks. Pt verbalized her understanding   Discharge Diagnoses:  Principal Problem:   Chest pain Active Problems:   GERD (gastroesophageal reflux disease)   IBS (irritable bowel syndrome)   Insomnia   Seasonal allergies  Chest pain: w/ minimal troponin elevation. Atypical chest pain, possibly GI related. Continue on tele.  Cardiac stress test was normal. Continue on aspirin. ACS r/o   Hypokalemia: potassium given    GERD: continue on PPI, carafate   IBS-M: not on any meds as per med rec   Insomnia: melatonin, xanax prn    Seasonal allergies: continue on home dose of zyrtec   Discharge  Instructions  Discharge Instructions     Diet - low sodium heart healthy   Complete by: As directed    Discharge instructions   Complete by: As directed    F/u w/ cardio, Dr. Saralyn Pilar, in 1-2 weeks. F/u w/ PCP in 1-2 weeks   Increase activity slowly   Complete by: As directed       Allergies as of 02/27/2022       Reactions   Biaxin [clarithromycin] Swelling   Ceftin [cefuroxime Axetil] Swelling   Penicillins Hives   Septra [sulfamethoxazole-trimethoprim] Swelling   Sulfa Antibiotics Swelling   Other reaction(s): Unknown   Amoxicillin    Other reaction(s): Unknown   Dicyclomine    Other reaction(s): Other (See Comments) Flushing   Hydrochlorothiazide    Other reaction(s): Other (See Comments) Swollen tongue   Linaclotide Diarrhea   Flushing   Nebivolol    Other reaction(s): Unknown   Telmisartan    Other reaction(s): Unknown        Medication List     TAKE these medications    ALPRAZolam 0.5 MG tablet Commonly known as: XANAX TAKE 1 TABLET BY MOUTH NIGHTLY AS NEEDED FOR SLEEP   aspirin 81 MG chewable tablet Chew 1 tablet (81 mg total) by mouth daily. Start taking on: February 28, 2022   cetirizine 10 MG tablet Commonly known as: ZYRTEC Take 10 mg by mouth daily.   cholecalciferol 25 MCG (1000 UNIT) tablet Commonly known as: VITAMIN D3 Take 1,000 Units by mouth daily.   cyanocobalamin 1000 MCG/ML injection Commonly known as: (VITAMIN B-12)  Inject 1 mL (1,000 mcg total) into the muscle every 14 (fourteen) days   naproxen sodium 220 MG tablet Commonly known as: ALEVE Take 220 mg by mouth daily as needed.   omeprazole 20 MG capsule Commonly known as: PRILOSEC TAKE 1 CAPSULE (20 MG TOTAL) BY MOUTH ONCE DAILY   sucralfate 1 g tablet Commonly known as: Carafate Take 1 tablet (1 g total) by mouth 4 (four) times daily.        Follow-up Information     Paraschos, Alexander, MD. Go in 1 week(s).   Specialty: Cardiology Contact information: [facility] [place] [address] [phone]                Allergies  Allergen Reactions   Biaxin [Clarithromycin] Swelling   Ceftin [Cefuroxime Axetil] Swelling   Penicillins Hives   Septra [Sulfamethoxazole-Trimethoprim] Swelling   Sulfa Antibiotics Swelling    Other reaction(s): Unknown   Amoxicillin     Other reaction(s): Unknown   Dicyclomine     Other reaction(s): Other (See Comments) Flushing   Hydrochlorothiazide     Other reaction(s): Other (See Comments) Swollen tongue   Linaclotide Diarrhea    Flushing   Nebivolol     Other reaction(s): Unknown   Telmisartan     Other reaction(s): Unknown    Consultations: Cardio    Procedures/Studies: CT Abdomen Pelvis W Contrast  Result Date: 02/26/2022 CLINICAL DATA:  Abdominal pain, acute, nonlocalized; Pulmonary embolism (PE) suspected, high prob. Worsening right upper abdominal pain wrapping around to the back with intermittent dizziness and palpitations. EXAM: CT ANGIOGRAPHY CHEST CT ABDOMEN AND PELVIS WITH CONTRAST TECHNIQUE: Multidetector CT imaging of the chest was performed using the standard protocol during bolus administration of intravenous contrast. Multiplanar CT image reconstructions and MIPs were obtained to evaluate the vascular anatomy. Multidetector CT imaging of the abdomen and pelvis was performed using the standard protocol during bolus administration of intravenous contrast. RADIATION DOSE REDUCTION: This exam was performed according to the departmental dose-optimization program which includes automated exposure control, adjustment of the mA and/or kV according to patient size and/or use of iterative reconstruction technique. CONTRAST:  93m OMNIPAQUE IOHEXOL 350 MG/ML SOLN COMPARISON:  CTA chest 09/17/2021. CT abdomen and pelvis 02/24/2022. FINDINGS: CTA CHEST FINDINGS Cardiovascular: Pulmonary arterial opacification is adequate without evidence of emboli. The main  pulmonary artery remains dilated, measuring 3.7 cm in diameter. The thoracic aorta is normal in caliber. The heart is normal in size. There is no pericardial effusion. Mediastinum/Nodes: No enlarged axillary, mediastinal, or hilar lymph nodes. Unremarkable esophagus and included thyroid. Lungs/Pleura: No pleural effusion or pneumothorax. Unchanged subtle nonspecific mosaic attenuation in the upper lobes. No lung consolidation or mass. Musculoskeletal: No acute osseous abnormality or suspicious osseous lesion. Review of the MIP images confirms the above findings. CT ABDOMEN and PELVIS FINDINGS Hepatobiliary: Hemangiomas are again noted in segments III and VI with the latter measuring 5 cm in size, unchanged. Other smaller hypodense liver lesions are also unchanged and may reflect cysts. There is no significant biliary dilatation status post cholecystectomy. Pancreas: Unremarkable. Spleen: Unremarkable. Adrenals/Urinary Tract: Unremarkable adrenal glands. No evidence of a renal mass, calculi, or hydronephrosis. Nondistended bladder. Stomach/Bowel: The stomach is unremarkable. There is no evidence of bowel obstruction or inflammation. The appendix is unremarkable. Vascular/Lymphatic: Normal caliber of the abdominal aorta. No enlarged lymph nodes. Reproductive: Subcentimeter hypodense focus in the uterine fundus, possibly a tiny fibroid. No adnexal mass. Other: No ascites or pneumoperitoneum. Musculoskeletal: Chronic bilateral  L5 pars defects with 7 mm anterolisthesis, moderately advanced L5-S1 disc degeneration, and severe bilateral neural foraminal stenosis. Review of the MIP images confirms the above findings. IMPRESSION: 1. No evidence of pulmonary emboli or other acute abnormality in the chest. 2. No acute abnormality identified in the abdomen or pelvis. 3. Unchanged hepatic hemangiomas. 4. Unchanged dilatation of the main pulmonary artery which can be seen with pulmonary arterial hypertension. Electronically  Signed   By: Logan Bores M.D.   On: 02/26/2022 15:51   CT Angio Chest PE W/Cm &/Or Wo Cm  Result Date: 02/26/2022 CLINICAL DATA:  Abdominal pain, acute, nonlocalized; Pulmonary embolism (PE) suspected, high prob. Worsening right upper abdominal pain wrapping around to the back with intermittent dizziness and palpitations. EXAM: CT ANGIOGRAPHY CHEST CT ABDOMEN AND PELVIS WITH CONTRAST TECHNIQUE: Multidetector CT imaging of the chest was performed using the standard protocol during bolus administration of intravenous contrast. Multiplanar CT image reconstructions and MIPs were obtained to evaluate the vascular anatomy. Multidetector CT imaging of the abdomen and pelvis was performed using the standard protocol during bolus administration of intravenous contrast. RADIATION DOSE REDUCTION: This exam was performed according to the departmental dose-optimization program which includes automated exposure control, adjustment of the mA and/or kV according to patient size and/or use of iterative reconstruction technique. CONTRAST:  54m OMNIPAQUE IOHEXOL 350 MG/ML SOLN COMPARISON:  CTA chest 09/17/2021. CT abdomen and pelvis 02/24/2022. FINDINGS: CTA CHEST FINDINGS Cardiovascular: Pulmonary arterial opacification is adequate without evidence of emboli. The main pulmonary artery remains dilated, measuring 3.7 cm in diameter. The thoracic aorta is normal in caliber. The heart is normal in size. There is no pericardial effusion. Mediastinum/Nodes: No enlarged axillary, mediastinal, or hilar lymph nodes. Unremarkable esophagus and included thyroid. Lungs/Pleura: No pleural effusion or pneumothorax. Unchanged subtle nonspecific mosaic attenuation in the upper lobes. No lung consolidation or mass. Musculoskeletal: No acute osseous abnormality or suspicious osseous lesion. Review of the MIP images confirms the above findings. CT ABDOMEN and PELVIS FINDINGS Hepatobiliary: Hemangiomas are again noted in segments III and VI with  the latter measuring 5 cm in size, unchanged. Other smaller hypodense liver lesions are also unchanged and may reflect cysts. There is no significant biliary dilatation status post cholecystectomy. Pancreas: Unremarkable. Spleen: Unremarkable. Adrenals/Urinary Tract: Unremarkable adrenal glands. No evidence of a renal mass, calculi, or hydronephrosis. Nondistended bladder. Stomach/Bowel: The stomach is unremarkable. There is no evidence of bowel obstruction or inflammation. The appendix is unremarkable. Vascular/Lymphatic: Normal caliber of the abdominal aorta. No enlarged lymph nodes. Reproductive: Subcentimeter hypodense focus in the uterine fundus, possibly a tiny fibroid. No adnexal mass. Other: No ascites or pneumoperitoneum. Musculoskeletal: Chronic bilateral L5 pars defects with 7 mm anterolisthesis, moderately advanced L5-S1 disc degeneration, and severe bilateral neural foraminal stenosis. Review of the MIP images confirms the above findings. IMPRESSION: 1. No evidence of pulmonary emboli or other acute abnormality in the chest. 2. No acute abnormality identified in the abdomen or pelvis. 3. Unchanged hepatic hemangiomas. 4. Unchanged dilatation of the main pulmonary artery which can be seen with pulmonary arterial hypertension. Electronically Signed   By: ALogan BoresM.D.   On: 02/26/2022 15:51   CT ABDOMEN PELVIS W CONTRAST  Result Date: 02/25/2022 CLINICAL DATA:  Bilateral lower abdominal pain for months. EXAM: CT ABDOMEN AND PELVIS WITH CONTRAST TECHNIQUE: Multidetector CT imaging of the abdomen and pelvis was performed using the standard protocol following bolus administration of intravenous contrast. RADIATION DOSE REDUCTION: This exam was performed according to the departmental dose-optimization  program which includes automated exposure control, adjustment of the mA and/or kV according to patient size and/or use of iterative reconstruction technique. CONTRAST:  122m ISOVUE-300 IOPAMIDOL  (ISOVUE-300) INJECTION 61% COMPARISON:  02/22/2022 FINDINGS: Lower chest: Lung bases are clear. Hepatobiliary: Circumscribed low-attenuation lesions demonstrated in segment 3 and segment 6 of the liver. Lesions demonstrate peripheral nodular enhancement pattern characteristic of cavernous hemangioma. No change since prior study. Surgical absence of the gallbladder. No bile duct dilatation. Pancreas: Unremarkable. No pancreatic ductal dilatation or surrounding inflammatory changes. Spleen: Normal in size without focal abnormality. Adrenals/Urinary Tract: Adrenal glands are unremarkable. Kidneys are normal, without renal calculi, focal lesion, or hydronephrosis. Bladder is unremarkable. Stomach/Bowel: Stomach is within normal limits. Appendix appears normal. No evidence of bowel wall thickening, distention, or inflammatory changes. Vascular/Lymphatic: Aortic atherosclerosis. No enlarged abdominal or pelvic lymph nodes. Reproductive: Uterus and bilateral adnexa are unremarkable. Other: No abdominal wall hernia or abnormality. No abdominopelvic ascites. Musculoskeletal: Spondylolysis with mild spondylolisthesis at L5-S1. IMPRESSION: 1. Stable appearance of hepatic cavernous hemangiomas. No follow-up is indicated. 2. No evidence of bowel obstruction or inflammation. 3. Mild aortic atherosclerosis. 4. Spondylolysis with mild spondylolisthesis at L5-S1. 5. No significant change since previous study. Electronically Signed   By: WLucienne CapersM.D.   On: 02/25/2022 20:15   CT ABDOMEN PELVIS W CONTRAST  Result Date: 02/22/2022 CLINICAL DATA:  Right-sided abdominal pain EXAM: CT ABDOMEN AND PELVIS WITH CONTRAST TECHNIQUE: Multidetector CT imaging of the abdomen and pelvis was performed using the standard protocol following bolus administration of intravenous contrast. RADIATION DOSE REDUCTION: This exam was performed according to the departmental dose-optimization program which includes automated exposure control,  adjustment of the mA and/or kV according to patient size and/or use of iterative reconstruction technique. CONTRAST:  712mOMNIPAQUE IOHEXOL 350 MG/ML SOLN COMPARISON:  06/16/21 FINDINGS: Lower chest: No acute abnormality. Hepatobiliary: Gallbladder has been surgically removed. A peripherally enhancing lesion is noted within the posterior right lobe of the liver measuring up to 5.2 cm consistent with hemangioma. No other focal mass is seen. Mild fullness of the biliary ductal dilatation is seen consistent with the post cholecystectomy state. Pancreas: Unremarkable. No pancreatic ductal dilatation or surrounding inflammatory changes. Spleen: Normal in size without focal abnormality. Adrenals/Urinary Tract: Adrenal glands are within normal limits. Kidneys demonstrate a normal enhancement pattern. No renal calculi or obstructive changes are seen. Bladder is within normal limits. Stomach/Bowel: The appendix is within normal limits. No obstructive or inflammatory changes of the colon are seen. Small bowel and stomach are unremarkable. Vascular/Lymphatic: No significant vascular findings are present. No enlarged abdominal or pelvic lymph nodes. Reproductive: Uterus and bilateral adnexa are unremarkable. Other: No abdominal wall hernia or abnormality. No abdominopelvic ascites. Musculoskeletal: No acute or significant osseous findings. IMPRESSION: Normal-appearing appendix. Status post cholecystectomy. Right hepatic hemangioma stable from previous exams. Electronically Signed   By: MaInez Catalina.D.   On: 02/22/2022 21:19   (Echo, Carotid, EGD, Colonoscopy, ERCP)    Subjective: Pt denies any complaints    Discharge Exam: Vitals:   02/27/22 1000 02/27/22 1300  BP: 113/83 110/80  Pulse: (!) 50 73  Resp: 10 18  Temp: 98.2 F (36.8 C) 98.4 F (36.9 C)  SpO2: 95% 95%   Vitals:   02/27/22 0515 02/27/22 0600 02/27/22 1000 02/27/22 1300  BP:  109/85 113/83 110/80  Pulse: (!) 51 (!) 56 (!) 50 73  Resp: 17 (!)  '9 10 18  '$ Temp:   98.2 F (36.8 C) 98.4 F (36.9 C)  TempSrc:   Oral Oral  SpO2: 94% 98% 95% 95%  Weight:      Height:        General: Pt is alert, awake, not in acute distress Cardiovascular: S1/S2 +, no rubs, no gallops Respiratory: CTA bilaterally, no wheezing, no rhonchi Abdominal: Soft, NT, ND, bowel sounds + Extremities: no edema, no cyanosis    The results of significant diagnostics from this hospitalization (including imaging, microbiology, ancillary and laboratory) are listed below for reference.     Microbiology: No results found for this or any previous visit (from the past 240 hour(s)).   Labs: BNP (last 3 results) No results for input(s): "BNP" in the last 8760 hours. Basic Metabolic Panel: Recent Labs  Lab 02/22/22 1815 02/22/22 1833 02/26/22 1135 02/27/22 0341  NA 139  --  141 140  K 4.3  --  3.8 3.4*  CL 110  --  109 107  CO2 23  --  23 24  GLUCOSE 109*  --  108* 109*  BUN 16  --  16 17  CREATININE 0.78  --  0.73 0.76  CALCIUM 10.3  --  10.0 9.4  MG  --  2.2  --   --    Liver Function Tests: Recent Labs  Lab 02/22/22 1815 02/26/22 1135  AST 40 42*  ALT 17 18  ALKPHOS 59 50  BILITOT 0.8 0.9  PROT 8.5* 7.7  ALBUMIN 5.0 4.5   Recent Labs  Lab 02/22/22 1815 02/26/22 1135  LIPASE 38 37   No results for input(s): "AMMONIA" in the last 168 hours. CBC: Recent Labs  Lab 02/22/22 1815 02/26/22 1135 02/27/22 0341  WBC 8.7 8.1 7.9  HGB 16.2* 15.4* 14.8  HCT 48.7* 47.1* 44.7  MCV 87.9 89.0 89.8  PLT 286 290 290   Cardiac Enzymes: No results for input(s): "CKTOTAL", "CKMB", "CKMBINDEX", "TROPONINI" in the last 168 hours. BNP: Invalid input(s): "POCBNP" CBG: Recent Labs  Lab 02/27/22 0322  GLUCAP 105*   D-Dimer No results for input(s): "DDIMER" in the last 72 hours. Hgb A1c No results for input(s): "HGBA1C" in the last 72 hours. Lipid Profile No results for input(s): "CHOL", "HDL", "LDLCALC", "TRIG", "CHOLHDL", "LDLDIRECT" in  the last 72 hours. Thyroid function studies No results for input(s): "TSH", "T4TOTAL", "T3FREE", "THYROIDAB" in the last 72 hours.  Invalid input(s): "FREET3" Anemia work up No results for input(s): "VITAMINB12", "FOLATE", "FERRITIN", "TIBC", "IRON", "RETICCTPCT" in the last 72 hours. Urinalysis    Component Value Date/Time   COLORURINE YELLOW (A) 02/26/2022 1134   APPEARANCEUR HAZY (A) 02/26/2022 1134   LABSPEC 1.027 02/26/2022 1134   PHURINE 5.0 02/26/2022 1134   GLUCOSEU NEGATIVE 02/26/2022 1134   HGBUR NEGATIVE 02/26/2022 1134   BILIRUBINUR NEGATIVE 02/26/2022 1134   KETONESUR 20 (A) 02/26/2022 1134   PROTEINUR NEGATIVE 02/26/2022 1134   NITRITE NEGATIVE 02/26/2022 1134   LEUKOCYTESUR NEGATIVE 02/26/2022 1134   Sepsis Labs Recent Labs  Lab 02/22/22 1815 02/26/22 1135 02/27/22 0341  WBC 8.7 8.1 7.9   Microbiology No results found for this or any previous visit (from the past 240 hour(s)).   Time coordinating discharge: Over 30 minutes  SIGNED:   Wyvonnia Dusky, MD  [facility] 02/27/2022, 1:45 PM Pager   If 7PM-7AM, please contact night-coverage [email or web address]

## 2022-03-06 ENCOUNTER — Other Ambulatory Visit: Payer: Self-pay

## 2022-03-06 MED ORDER — CYANOCOBALAMIN 1000 MCG/ML IJ SOLN
INTRAMUSCULAR | 5 refills | Status: DC
  Filled 2022-03-06: qty 2, 28d supply, fill #0
  Filled 2022-04-07: qty 2, 28d supply, fill #1
  Filled 2022-05-05: qty 2, 28d supply, fill #2
  Filled 2022-05-26: qty 2, 28d supply, fill #3
  Filled 2022-06-19: qty 2, 28d supply, fill #4
  Filled 2022-07-20 – 2022-07-24 (×2): qty 2, 28d supply, fill #5

## 2022-03-09 ENCOUNTER — Other Ambulatory Visit: Payer: Self-pay

## 2022-03-09 MED ORDER — OMEPRAZOLE 20 MG PO CPDR
DELAYED_RELEASE_CAPSULE | ORAL | 1 refills | Status: DC
  Filled 2022-03-09 – 2022-03-10 (×4): qty 30, 30d supply, fill #0
  Filled 2022-04-07: qty 30, 30d supply, fill #1

## 2022-03-10 ENCOUNTER — Other Ambulatory Visit: Payer: Self-pay

## 2022-03-13 ENCOUNTER — Other Ambulatory Visit: Payer: Self-pay

## 2022-04-05 IMAGING — CR DG SHOULDER 2+V*R*
1 series · 3 of 3 positions shown · non-contrast
Comparison: None.

CLINICAL DATA: Right shoulder pain

EXAM:
RIGHT SHOULDER - 2+ VIEW

[Series 1: dg shoulder right · 0.14mm/px · 3 of 3 slices shown]
[im 1/3]
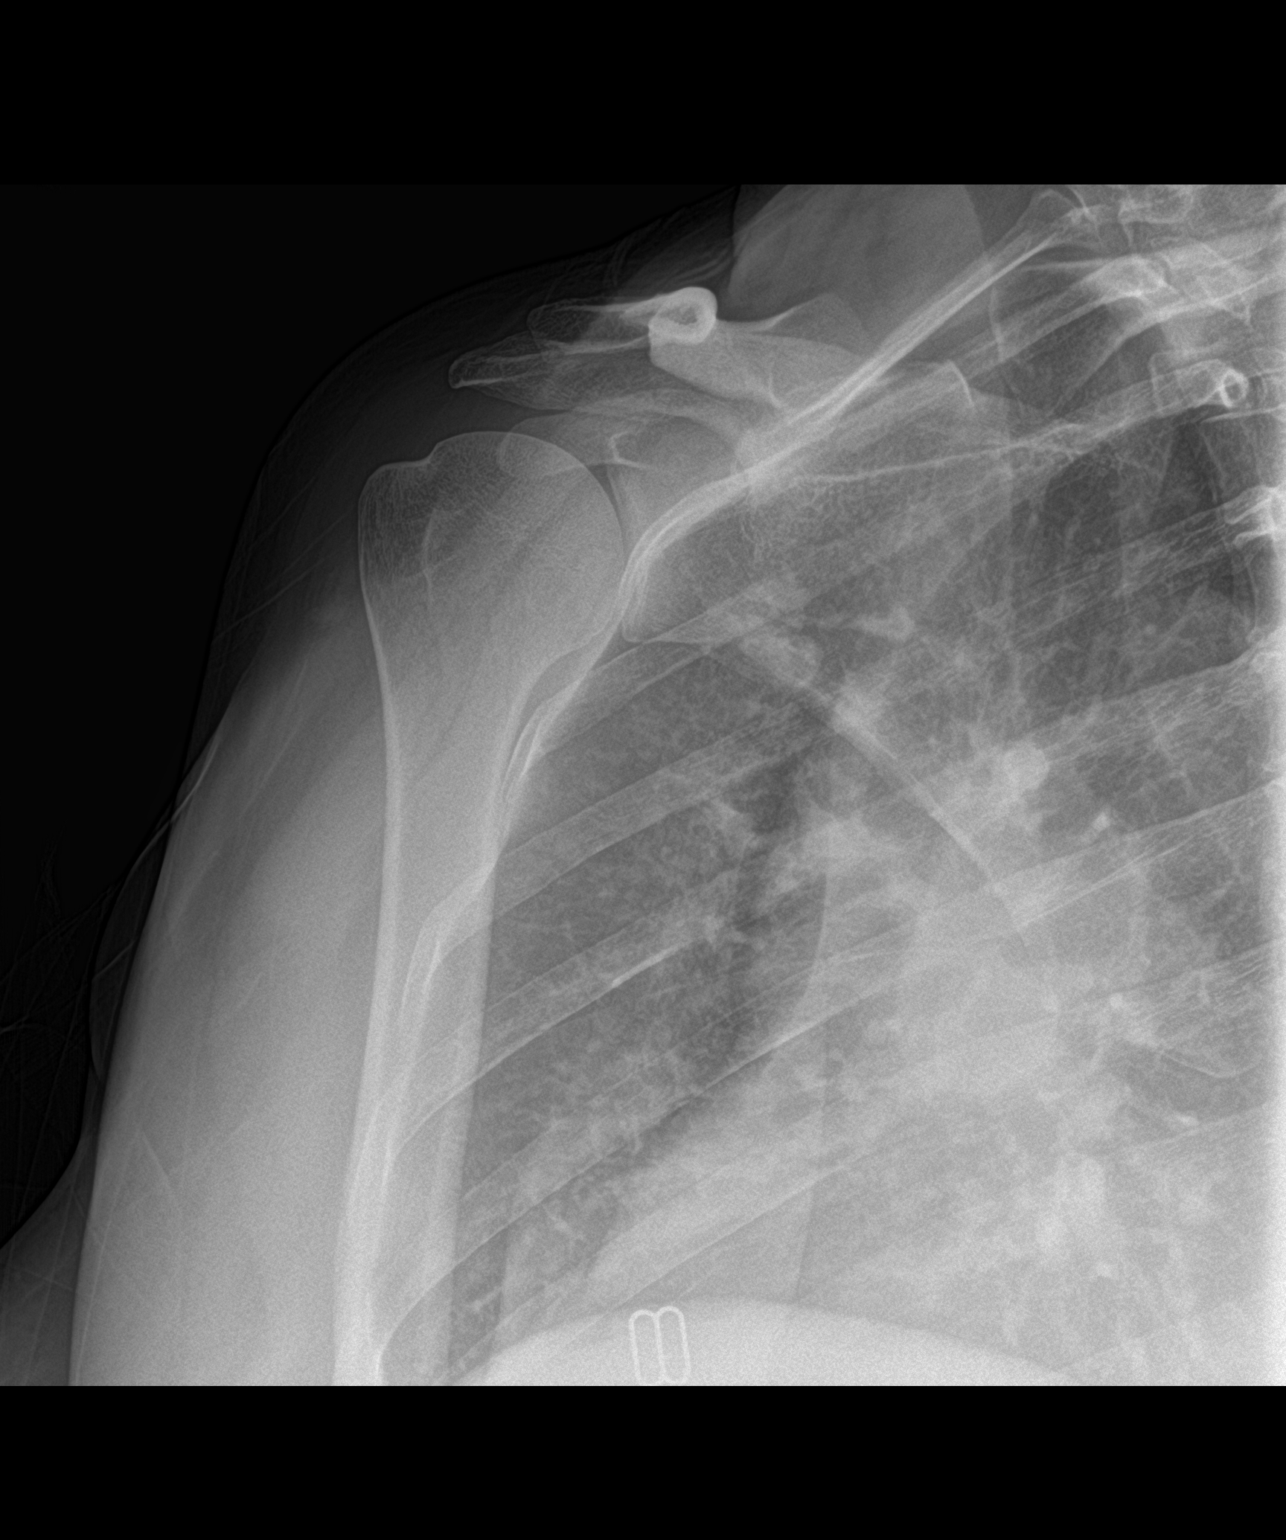
[im 2/3]
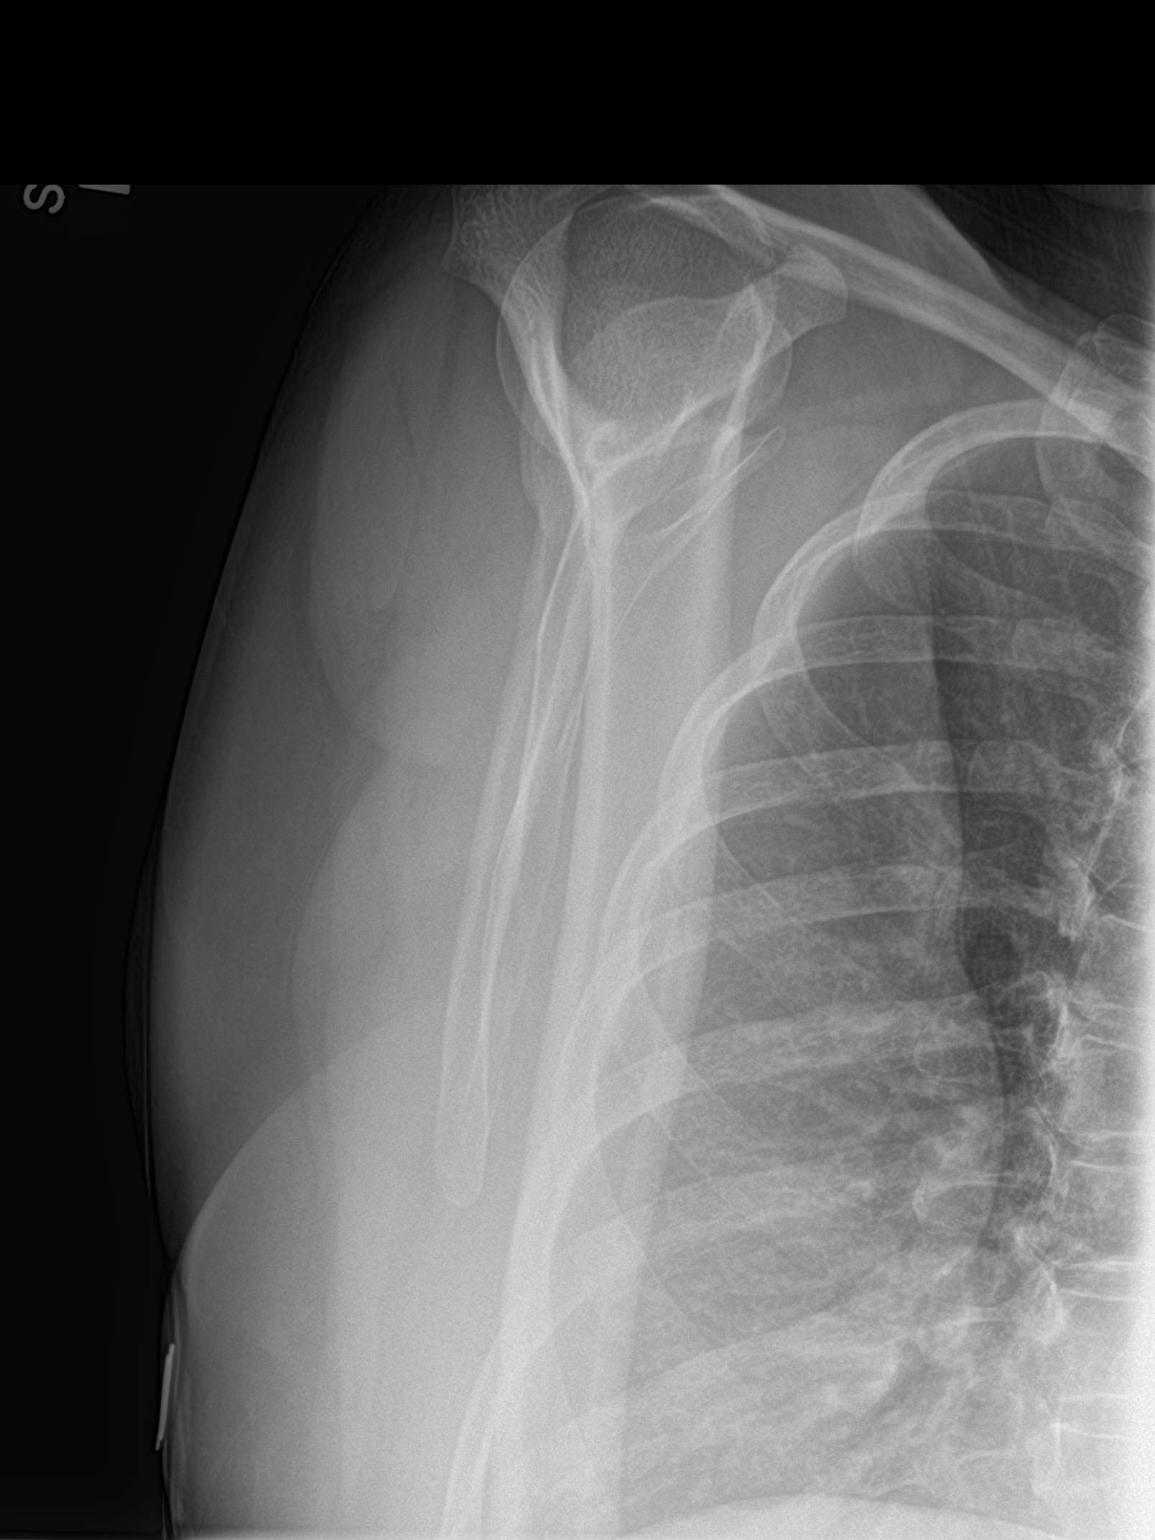
[im 3/3]
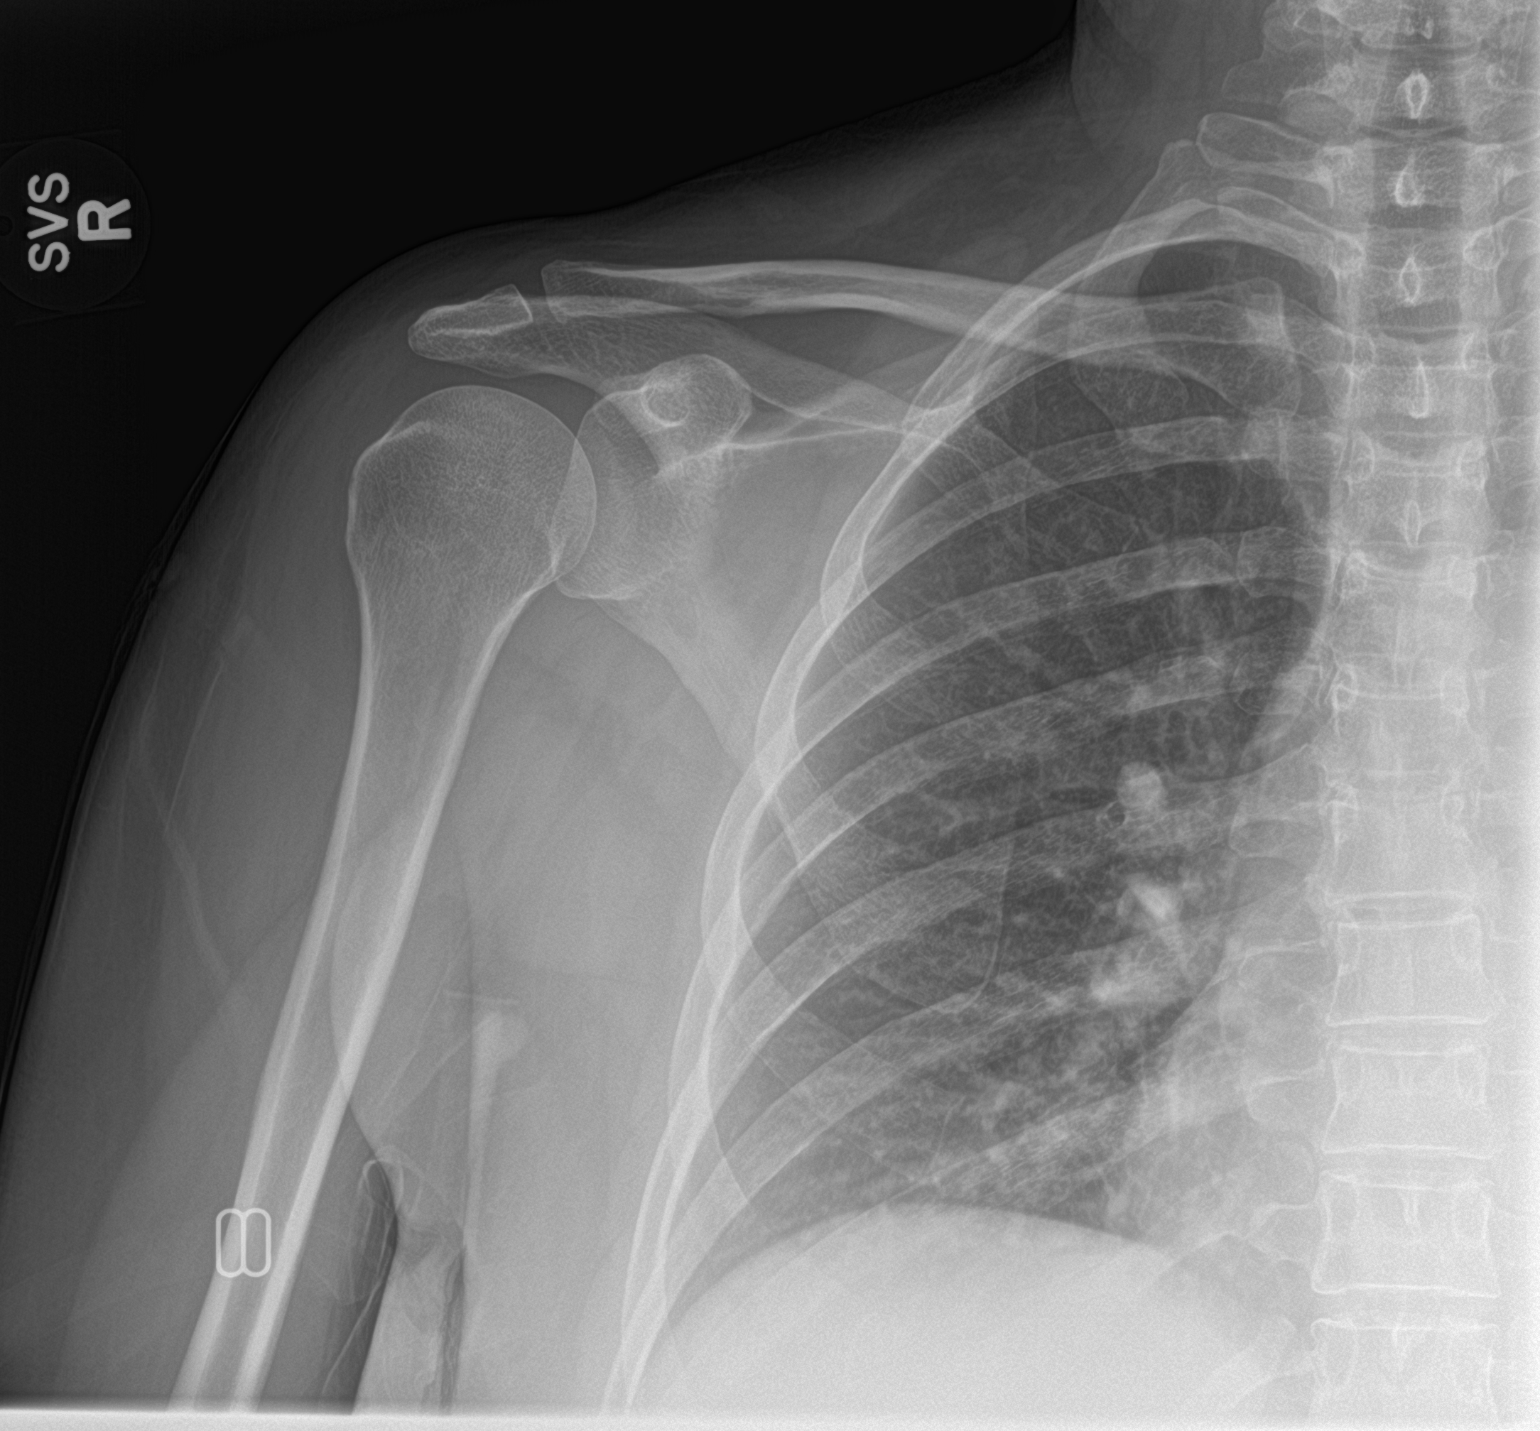

[3 of 3 positions shown; findings below may reference images not displayed]

FINDINGS: No fracture or dislocation is seen.

The joint spaces are preserved.

Visualized soft tissues are within normal limits.

Visualized right lung is clear.
IMPRESSION: Negative.

## 2022-04-07 ENCOUNTER — Other Ambulatory Visit: Payer: Self-pay

## 2022-04-10 ENCOUNTER — Other Ambulatory Visit: Payer: Self-pay

## 2022-04-13 ENCOUNTER — Other Ambulatory Visit: Payer: Self-pay

## 2022-05-05 ENCOUNTER — Other Ambulatory Visit: Payer: Self-pay

## 2022-05-06 ENCOUNTER — Other Ambulatory Visit: Payer: Self-pay

## 2022-05-06 MED ORDER — OMEPRAZOLE 20 MG PO CPDR
DELAYED_RELEASE_CAPSULE | ORAL | 1 refills | Status: DC
  Filled 2022-05-06: qty 30, 30d supply, fill #0
  Filled 2022-05-26 – 2022-06-06 (×2): qty 30, 30d supply, fill #1

## 2022-05-08 ENCOUNTER — Other Ambulatory Visit: Payer: Self-pay | Admitting: Internal Medicine

## 2022-05-08 DIAGNOSIS — Z1231 Encounter for screening mammogram for malignant neoplasm of breast: Secondary | ICD-10-CM

## 2022-05-10 ENCOUNTER — Other Ambulatory Visit: Payer: Self-pay

## 2022-05-10 MED ORDER — LEVOTHYROXINE SODIUM 50 MCG PO TABS
ORAL_TABLET | ORAL | 3 refills | Status: DC
  Filled 2022-05-10: qty 30, 30d supply, fill #0
  Filled 2022-05-26: qty 30, 30d supply, fill #1
  Filled 2022-07-08: qty 30, 30d supply, fill #2

## 2022-05-11 ENCOUNTER — Other Ambulatory Visit: Payer: Self-pay

## 2022-05-19 ENCOUNTER — Other Ambulatory Visit: Payer: Self-pay

## 2022-05-19 MED ORDER — AZITHROMYCIN 250 MG PO TABS
ORAL_TABLET | ORAL | 0 refills | Status: DC
  Filled 2022-05-19: qty 6, 5d supply, fill #0

## 2022-05-26 ENCOUNTER — Other Ambulatory Visit: Payer: Self-pay

## 2022-05-27 ENCOUNTER — Other Ambulatory Visit: Payer: Self-pay

## 2022-05-28 ENCOUNTER — Other Ambulatory Visit: Payer: Self-pay

## 2022-05-28 MED ORDER — HYDRALAZINE HCL 10 MG PO TABS
ORAL_TABLET | ORAL | 11 refills | Status: DC
  Filled 2022-05-28: qty 90, 30d supply, fill #0
  Filled 2022-07-08: qty 90, 30d supply, fill #1

## 2022-06-01 ENCOUNTER — Other Ambulatory Visit: Payer: Self-pay

## 2022-06-02 ENCOUNTER — Other Ambulatory Visit: Payer: Self-pay

## 2022-06-02 MED ORDER — PEG 3350-KCL-NA BICARB-NACL 420 G PO SOLR
ORAL | 0 refills | Status: DC
  Filled 2022-06-02: qty 4000, 1d supply, fill #0

## 2022-06-03 ENCOUNTER — Other Ambulatory Visit: Payer: Self-pay

## 2022-06-04 ENCOUNTER — Other Ambulatory Visit: Payer: Self-pay

## 2022-06-07 ENCOUNTER — Other Ambulatory Visit: Payer: Self-pay

## 2022-06-08 ENCOUNTER — Other Ambulatory Visit: Payer: Self-pay

## 2022-06-19 ENCOUNTER — Other Ambulatory Visit: Payer: Self-pay

## 2022-06-21 ENCOUNTER — Other Ambulatory Visit: Payer: Self-pay

## 2022-06-21 MED ORDER — ALPRAZOLAM 0.5 MG PO TABS
0.5000 mg | ORAL_TABLET | Freq: Every evening | ORAL | 5 refills | Status: DC | PRN
  Filled 2022-06-21: qty 30, 30d supply, fill #0
  Filled 2022-07-21: qty 30, 30d supply, fill #1
  Filled 2022-08-26: qty 30, 30d supply, fill #2
  Filled 2022-10-09: qty 30, 30d supply, fill #3
  Filled 2022-11-30: qty 30, 30d supply, fill #4
  Filled 2022-12-16: qty 30, 30d supply, fill #5

## 2022-06-22 ENCOUNTER — Emergency Department: Payer: 59

## 2022-06-22 ENCOUNTER — Other Ambulatory Visit: Payer: Self-pay

## 2022-06-22 ENCOUNTER — Encounter: Payer: Self-pay | Admitting: Radiology

## 2022-06-22 ENCOUNTER — Emergency Department
Admission: EM | Admit: 2022-06-22 | Discharge: 2022-06-22 | Disposition: A | Discharge status: Home / Self Care | Payer: 59 | Attending: Emergency Medicine | Admitting: Emergency Medicine

## 2022-06-22 DIAGNOSIS — Z9049 Acquired absence of other specified parts of digestive tract: Secondary | ICD-10-CM | POA: Insufficient documentation

## 2022-06-22 DIAGNOSIS — R1031 Right lower quadrant pain: Secondary | ICD-10-CM | POA: Insufficient documentation

## 2022-06-22 DIAGNOSIS — R109 Unspecified abdominal pain: Secondary | ICD-10-CM

## 2022-06-22 DIAGNOSIS — I10 Essential (primary) hypertension: Secondary | ICD-10-CM | POA: Insufficient documentation

## 2022-06-22 DIAGNOSIS — E039 Hypothyroidism, unspecified: Secondary | ICD-10-CM | POA: Insufficient documentation

## 2022-06-22 LAB — CBC
HCT: 46 % (ref 36.0–46.0)
Hemoglobin: 15.3 g/dL — ABNORMAL HIGH (ref 12.0–15.0)
MCH: 29 pg (ref 26.0–34.0)
MCHC: 33.3 g/dL (ref 30.0–36.0)
MCV: 87.3 fL (ref 80.0–100.0)
Platelets: 315 10*3/uL (ref 150–400)
RBC: 5.27 MIL/uL — ABNORMAL HIGH (ref 3.87–5.11)
RDW: 12.5 % (ref 11.5–15.5)
WBC: 8.7 10*3/uL (ref 4.0–10.5)
nRBC: 0 % (ref 0.0–0.2)

## 2022-06-22 LAB — URINALYSIS, ROUTINE W REFLEX MICROSCOPIC
Bilirubin Urine: NEGATIVE
Glucose, UA: NEGATIVE mg/dL
Hgb urine dipstick: NEGATIVE
Ketones, ur: NEGATIVE mg/dL
Leukocytes,Ua: NEGATIVE
Nitrite: NEGATIVE
Protein, ur: NEGATIVE mg/dL
Specific Gravity, Urine: 1.001 — ABNORMAL LOW (ref 1.005–1.030)
pH: 6 (ref 5.0–8.0)

## 2022-06-22 LAB — HEPATIC FUNCTION PANEL
ALT: 16 U/L (ref 0–44)
AST: 38 U/L (ref 15–41)
Albumin: 4.5 g/dL (ref 3.5–5.0)
Alkaline Phosphatase: 59 U/L (ref 38–126)
Bilirubin, Direct: 0.1 mg/dL (ref 0.0–0.2)
Indirect Bilirubin: 0.8 mg/dL (ref 0.3–0.9)
Total Bilirubin: 0.9 mg/dL (ref 0.3–1.2)
Total Protein: 7.6 g/dL (ref 6.5–8.1)

## 2022-06-22 LAB — BASIC METABOLIC PANEL
Anion gap: 9 (ref 5–15)
BUN: 13 mg/dL (ref 6–20)
CO2: 22 mmol/L (ref 22–32)
Calcium: 9.8 mg/dL (ref 8.9–10.3)
Chloride: 108 mmol/L (ref 98–111)
Creatinine, Ser: 0.64 mg/dL (ref 0.44–1.00)
GFR, Estimated: >60 mL/min (ref >60)
Glucose, Bld: 97 mg/dL (ref 70–99)
Potassium: 3.6 mmol/L (ref 3.5–5.1)
Sodium: 139 mmol/L (ref 135–145)

## 2022-06-22 LAB — LIPASE, BLOOD: Lipase: 40 U/L (ref 11–51)

## 2022-06-22 MED ORDER — METHOCARBAMOL 500 MG PO TABS
500.0000 mg | ORAL_TABLET | Freq: Three times a day (TID) | ORAL | 0 refills | Status: AC | PRN
  Filled 2022-06-22: qty 15, 5d supply, fill #0

## 2022-06-22 MED ORDER — KETOROLAC TROMETHAMINE 30 MG/ML IJ SOLN
30.0000 mg | Freq: Once | INTRAMUSCULAR | Status: AC
  Administered 2022-06-22: 30 mg via INTRAVENOUS
  Filled 2022-06-22: qty 1

## 2022-06-22 MED ORDER — IOHEXOL 300 MG/ML  SOLN
100.0000 mL | Freq: Once | INTRAMUSCULAR | Status: AC | PRN
  Administered 2022-06-22: 100 mL via INTRAVENOUS

## 2022-06-22 MED ORDER — MORPHINE SULFATE (PF) 4 MG/ML IV SOLN
4.0000 mg | Freq: Once | INTRAVENOUS | Status: AC
  Administered 2022-06-22: 4 mg via INTRAVENOUS
  Filled 2022-06-22: qty 1

## 2022-06-22 MED ORDER — SODIUM CHLORIDE 0.9 % IV BOLUS
500.0000 mL | Freq: Once | INTRAVENOUS | Status: AC
  Administered 2022-06-22: 500 mL via INTRAVENOUS

## 2022-06-22 MED ORDER — ONDANSETRON HCL 4 MG/2ML IJ SOLN
4.0000 mg | Freq: Once | INTRAMUSCULAR | Status: AC
  Administered 2022-06-22: 4 mg via INTRAVENOUS
  Filled 2022-06-22: qty 2

## 2022-06-22 MED ORDER — METHOCARBAMOL 500 MG PO TABS: 500.0000 mg | ORAL_TABLET | Freq: Three times a day (TID) | ORAL | 0 refills | Status: DC | PRN

## 2022-06-22 NOTE — Discharge Instructions (Addendum)
Take Robaxin up to three times daily for the next five days.

## 2022-06-22 NOTE — ED Triage Notes (Signed)
Pt sts that she was is having extreme right flank pain that wraps around. Pt sts that its like a knife is stuck in my side. Pt has hx of kidney stones.

## 2022-06-22 NOTE — ED Provider Notes (Signed)
[facility] Provider Note  Patient Contact: 3:31 PM (approximate)   History   Flank Pain   HPI  Jennifer Mclaughlin is a 51 y.o. female with a history of anemia, hypertension and hypothyroidism, presents to the emergency department with right-sided flank pain that radiates to the right upper quadrant.  Patient has had prior cholecystectomy.  She states that pain started after she got off of work earlier today.  She states that she worked her entire shift.  She denies falls or mechanisms of trauma.  No associated hematuria, vomiting or diarrhea.  No chest pain, chest tightness or shortness of breath.  She states that she has experienced similar flank pain associated with nephrolithiasis in the past.      Physical Exam   Triage Vital Signs: ED Triage Vitals [06/22/22 1502]  Enc Vitals Group     BP (!) 173/128     Pulse Rate 98     Resp 18     Temp 98 F (36.7 C)     Temp Source Oral     SpO2 95 %     Weight 168 lb (76.2 kg)     Height      Head Circumference      Peak Flow      Pain Score 10     Pain Loc      Pain Edu?      Excl. in [place]?     Most recent vital signs: Vitals:   06/22/22 1502  BP: (!) 173/128  Pulse: 98  Resp: 18  Temp: 98 F (36.7 C)  SpO2: 95%     General: Alert and in no acute distress. Eyes:  PERRL. EOMI. Head: No acute traumatic findings ENT:      Nose: No congestion/rhinnorhea.      Mouth/Throat: Mucous membranes are moist. Neck: No stridor. No cervical spine tenderness to palpation. Cardiovascular:  Good peripheral perfusion Respiratory: Normal respiratory effort without tachypnea or retractions. Lungs CTAB. Good air entry to the bases with no decreased or absent breath sounds. Gastrointestinal: Bowel sounds 4 quadrants. Soft and nontender to palpation. No guarding or rigidity. No palpable masses. No distention. No CVA tenderness. Patient has right sided CVA tenderness.  Musculoskeletal: Full range of motion to all  extremities.  Neurologic:  No gross focal neurologic deficits are appreciated.  Skin:   No rash noted Other:   ED Results / Procedures / Treatments   Labs (all labs ordered are listed, but only abnormal results are displayed) Labs Reviewed  URINALYSIS, ROUTINE W REFLEX MICROSCOPIC - Abnormal; Notable for the following components:      Result Value   Color, Urine STRAW (*)    APPearance HAZY (*)    Specific Gravity, Urine 1.001 (*)    All other components within normal limits  CBC - Abnormal; Notable for the following components:   RBC 5.27 (*)    Hemoglobin 15.3 (*)    All other components within normal limits  BASIC METABOLIC PANEL  LIPASE, BLOOD  HEPATIC FUNCTION PANEL      RADIOLOGY  I personally viewed and evaluated these images as part of my medical decision making, as well as reviewing the written report by the radiologist.  ED Provider Interpretation: CT abdomen pelvis and CT of the thoracic spine unremarkable.   PROCEDURES:  Critical Care performed: No  Procedures   MEDICATIONS ORDERED IN ED: Medications  sodium chloride 0.9 % bolus 500 mL (has no administration in time range)  morphine (PF) 4 MG/ML injection 4 mg (has no administration in time range)  ondansetron (ZOFRAN) injection 4 mg (has no administration in time range)     IMPRESSION / MDM / ASSESSMENT AND PLAN / ED COURSE  I reviewed the triage vital signs and the nursing notes.                              Assessment and plan: Flank pain:  Differential diagnosis includes nephrolithiasis, pyelonephritis, intra-abdominal abscess, small bowel obstruction, shingles...  51 year old female with past medical history stated above, presents to the emergency department with right-sided flank pain.  Patient was hypertensive at triage but vital signs were otherwise reassuring.  Patient was alert and nontoxic-appearing but did seem uncomfortable with right-sided CVA tenderness.  No abdominal tenderness  with guarding.  CBC, BMP and urinalysis reassuring.  Will obtain CT abdomen pelvis and administer morphine with a 500 cc normal saline bolus and will reassess.   CT abdomen pelvis and CT of the thoracic spine unremarkable.  Patient felt improved after morphine.  Will prescribe patient a short course of Robaxin for pain.  Cough and patient to look for rash over the next several days shingles is in my current differential diagnosis.   FINAL CLINICAL IMPRESSION(S) / ED DIAGNOSES   Final diagnoses:  Flank pain     Rx / DC Orders   ED Discharge Orders     None        Note:  This document was prepared using Dragon voice recognition software and may include unintentional dictation errors.   Vallarie Mare [place], PA-C 06/22/22 1711    Naaman Plummer, MD 06/22/22 [phone]

## 2022-07-08 ENCOUNTER — Other Ambulatory Visit: Payer: Self-pay

## 2022-07-08 MED ORDER — OMEPRAZOLE 20 MG PO CPDR
20.0000 mg | DELAYED_RELEASE_CAPSULE | Freq: Every day | ORAL | 1 refills | Status: DC
  Filled 2022-07-08 – 2022-08-26 (×3): qty 30, 30d supply, fill #0
  Filled 2022-10-18: qty 30, fill #0
  Filled 2022-10-20: qty 30, 30d supply, fill #0
  Filled 2022-11-30: qty 30, 30d supply, fill #1

## 2022-07-10 ENCOUNTER — Ambulatory Visit
Admission: RE | Admit: 2022-07-10 | Discharge: 2022-07-10 | Disposition: A | Discharge status: Home / Self Care | Payer: 59 | Source: Ambulatory Visit | Attending: Internal Medicine | Admitting: Internal Medicine

## 2022-07-10 DIAGNOSIS — Z1231 Encounter for screening mammogram for malignant neoplasm of breast: Secondary | ICD-10-CM | POA: Diagnosis present

## 2022-07-20 ENCOUNTER — Other Ambulatory Visit: Payer: Self-pay

## 2022-07-20 NOTE — Patient Instructions (Signed)
Thyroid-Stimulating Hormone Test Why am I having this test? The thyroid is a gland in the lower front of the neck. It makes hormones that affect many body parts and systems, including the system that affects how quickly the body burns fuel for energy (metabolism). The pituitary gland is located just below the brain, behind the eyes and nasal passages. It helps maintain thyroid hormone levels and thyroid gland function. You may have a thyroid-stimulating hormone (TSH) test if you have possible symptoms of abnormal thyroid hormone levels. This test can help your health care provider: Diagnose a disorder of the thyroid gland or pituitary gland. Manage your condition and treatment if you have an underactive thyroid (hypothyroidism) or an overactive thyroid (hyperthyroidism). Newborn babies may have this test done to screen for hypothyroidism that is present at birth (congenital). What is being tested? This test measures the amount of TSH in your blood. TSH may also be called thyrotropin. When the thyroid does not make enough hormones, the pituitary gland releases TSH into the bloodstream to stimulate the thyroid gland to make more hormones. What kind of sample is taken?     A blood sample is required for this test. It is usually collected by inserting a needle into a blood vessel. For newborns, a small amount of blood may be collected from the umbilical cord, or by using a small needle to prick the baby's heel (heel stick). Tell a health care provider about: All medicines you are taking, including vitamins, herbs, eye drops, creams, and over-the-counter medicines. Any bleeding problems you have. Any surgeries you have had. Any medical conditions you have. Whether you are pregnant or may be pregnant. How are the results reported? Your test results will be reported as a value that indicates how much TSH is in your blood. Your health care provider will compare your results to normal ranges that were  established after testing a large group of people (reference ranges). Reference ranges may vary among labs and hospitals. For this test, common reference ranges are: Adult: 2-10 microunits/mL or 2-10 milliunits/L. Newborn: Heel stick: 3-18 microunits/mL or 3-18 milliunits/L. Umbilical cord: 3-12 microunits/mL or 3-12 milliunits/L. What do the results mean? Results that are within the reference range are considered normal. This means that you have a normal amount of TSH in your blood. Results that are higher than the reference range mean that your TSH levels are too high. This may mean: Your thyroid gland is not making enough thyroid hormones. Your thyroid medicine dosage is too low. You have a tumor on your pituitary gland. This is rare. Results that are lower than the reference range mean that your TSH levels are too low. This may be caused by hyperthyroidism or by a problem with the pituitary gland function. Talk with your health care provider about what your results mean. Questions to ask your health care provider Ask your health care provider, or the department that is doing the test: When will my results be ready? How will I get my results? What are my treatment options? What other tests do I need? What are my next steps? Summary You may have a thyroid-stimulating hormone (TSH) test if you have possible symptoms of abnormal thyroid hormone levels. The thyroid is a gland in the lower front of the neck. It makes hormones that affect many body parts and systems. The pituitary gland is located just below the brain, behind the eyes and nasal passages. It helps maintain thyroid hormone levels and thyroid gland function. This test   measures the amount of TSH in your blood. TSH is made by the pituitary gland. It may also be called thyrotropin. This information is not intended to replace advice given to you by your health care provider. Make sure you discuss any questions you have with your  health care provider. Document Revised: 08/19/2021 Document Reviewed: 08/19/2021 Elsevier Patient Education  2023 [employer] Inc.  

## 2022-07-21 ENCOUNTER — Other Ambulatory Visit: Payer: Self-pay

## 2022-07-21 ENCOUNTER — Ambulatory Visit (INDEPENDENT_AMBULATORY_CARE_PROVIDER_SITE_OTHER): Payer: Self-pay | Admitting: Nurse Practitioner

## 2022-07-21 ENCOUNTER — Encounter: Payer: Self-pay | Admitting: Nurse Practitioner

## 2022-07-21 VITALS — BP 144/93 | HR 76 | Ht 63.0 in | Wt 181.0 lb

## 2022-07-21 DIAGNOSIS — R7989 Other specified abnormal findings of blood chemistry: Secondary | ICD-10-CM

## 2022-07-21 DIAGNOSIS — E039 Hypothyroidism, unspecified: Secondary | ICD-10-CM

## 2022-07-21 NOTE — Progress Notes (Signed)
Endocrinology Consult Note                                         07/21/2022, 3:55 PM  Subjective:   Subjective    Jennifer Mclaughlin is a 51 y.o.-year-old female patient being seen in consultation for hypothyroidism referred by Idelle Crouch, MD.   Past Medical History:  Diagnosis Date   Anemia    Hypertension    Hypothyroidism     Past Surgical History:  Procedure Laterality Date   CHOLECYSTECTOMY     NASAL SINUS SURGERY     TONSILLECTOMY     TUBAL LIGATION      Social History   Socioeconomic History   Marital status: Single    Spouse name: Not on file   Number of children: Not on file   Years of education: Not on file   Highest education level: Not on file  Occupational History   Not on file  Tobacco Use   Smoking status: Former   Smokeless tobacco: Never  Substance and Sexual Activity   Alcohol use: Yes    Alcohol/week: 0.0 standard drinks of alcohol    Comment: occas   Drug use: No   Sexual activity: Not on file  Other Topics Concern   Not on file  Social History Narrative   Not on file   Social Determinants of Health   Financial Resource Strain: Not on file  Food Insecurity: Not on file  Transportation Needs: Not on file  Physical Activity: Not on file  Stress: Not on file  Social Connections: Not on file    Family History  Problem Relation Age of Onset   Thyroid cancer Father    Breast cancer Neg Hx     Outpatient Encounter Medications as of 07/21/2022  Medication Sig   ALPRAZolam (XANAX) 0.5 MG tablet TAKE 1 TABLET BY MOUTH NIGHTLY AS NEEDED FOR SLEEP   cetirizine (ZYRTEC) 10 MG tablet Take 10 mg by mouth daily.   cyanocobalamin (,VITAMIN B-12,) 1000 MCG/ML injection Inject 1 mL (1,000 mcg total) into the muscle every 14 (fourteen) days   hydrALAZINE (APRESOLINE) 10 MG tablet Take 1 tablet (10 mg total) by mouth 3 (three) times daily as needed (For blood pressure  greater than 150/90)   omeprazole (PRILOSEC) 20 MG capsule TAKE 1 CAPSULE (20 MG TOTAL) BY MOUTH ONCE DAILY   [DISCONTINUED] azithromycin (ZITHROMAX) 250 MG tablet 2 tabs on day one, then 1 tab daily x 4 days (Patient not taking: Reported on 07/21/2022)   [DISCONTINUED] cholecalciferol (VITAMIN D3) 25 MCG (1000 UNIT) tablet Take 1,000 Units by mouth daily. (Patient not taking: Reported on 07/21/2022)   [DISCONTINUED] cyanocobalamin (VITAMIN B12) 1000 MCG/ML injection Inject 1 mL (1,000 mcg total) into the muscle every 14 (fourteen) days (Patient not taking: Reported on 07/21/2022)   [DISCONTINUED] levothyroxine (SYNTHROID) 50 MCG tablet Take 1 tablet (50 mcg total) by mouth once daily Take on an empty stomach with a glass of water at least 30-60 minutes  before breakfast. (Patient not taking: Reported on 07/21/2022)   [DISCONTINUED] naproxen sodium (ALEVE) 220 MG tablet Take 220 mg by mouth daily as needed. (Patient not taking: Reported on 07/21/2022)   [DISCONTINUED] polyethylene glycol-electrolytes (NULYTELY) 420 g solution Take 4,000 mLs by mouth once for 1 dose (Patient not taking: Reported on 07/21/2022)   [DISCONTINUED] sucralfate (CARAFATE) 1 g tablet Take 1 tablet (1 g total) by mouth 4 (four) times daily. (Patient not taking: Reported on 07/21/2022)   No facility-administered encounter medications on file as of 07/21/2022.    ALLERGIES: Allergies  Allergen Reactions   Biaxin [Clarithromycin] Swelling   Ceftin [Cefuroxime Axetil] Swelling   Penicillins Hives   Septra [Sulfamethoxazole-Trimethoprim] Swelling   Sulfa Antibiotics Swelling    Other reaction(s): Unknown   Amoxicillin     Other reaction(s): Unknown   Dicyclomine     Other reaction(s): Other (See Comments) Flushing   Hydrochlorothiazide     Other reaction(s): Other (See Comments) Swollen tongue   Linaclotide Diarrhea    Flushing   Nebivolol     Other reaction(s): Unknown   Telmisartan     Other reaction(s): Unknown    VACCINATION STATUS:  There is no immunization history on file for this patient.   HPI   Jennifer Mclaughlin is a patient with the above medical history. she was diagnosed with problems with her thyroid dating as far back as 2016.  She notes during pregnancy, she had a ?nodule removed as it flared up (no history of such surgery was available to review).  She notes her thyroid has fluctuated between hypoactive and normal for years but reports most recently her symptoms have worsened and become more consistent.  She was recently started on Levothyroxine 50 mcg po daily by her PCP but reports she felt terrible after taking it, says it caused her heart to race.  She notes she is sensitive to medications in general.     I reviewed patient's thyroid tests:  No results found for: "TSH", "FREET4"   SEE care everywhere for recent thyroid labs.  Pt describes: - weight gain despite eating healthier - fatigue - cold intolerance - mood swings - dry skin - hair loss - generalized edema  Pt denies feeling nodules in neck, hoarseness, dysphagia/odynophagia, SOB with lying down.  she does have strong family history of thyroid disorders in her cousins, paternal aunts.  Her father did have thyroid cancer requiring surgical removal.  No history of radiation therapy to head or neck.  No recent use of iodine supplements.  Denies use of Biotin containing supplements.  I reviewed her chart and she also has a history of pernicious anemia, GERD, HTN, IBS, HLD.   ROS:  Constitutional: + weight gain, + fatigue, fluctuating hot/cold spells Eyes: no blurry vision, no xerophthalmia ENT: no sore throat, no nodules palpated in throat, + intermittent dysphagia/odynophagia, no hoarseness Cardiovascular: no chest pain, no SOB, no palpitations, + generalized swelling (mostly in right hand and left foot) Respiratory: no cough, no SOB Gastrointestinal: no nausea/vomiting/diarrhea Musculoskeletal: no muscle/joint  aches Skin: no rashes, + hair loss Neurological: no tremors, no numbness, no tingling, no dizziness Psychiatric: + mood swings   Objective:   Objective     BP (!) 144/93 (BP Location: Left Arm, Patient Position: Sitting, Cuff Size: Normal)   Pulse 76   Ht '5\' 3"'$  (1.6 m)   Wt 181 lb (82.1 kg)   BMI 32.06 kg/m  Wt Readings from Last 3 Encounters:  07/21/22 181 lb (  82.1 kg)  06/22/22 168 lb (76.2 kg)  02/26/22 175 lb (79.4 kg)    BP Readings from Last 3 Encounters:  07/21/22 (!) 144/93  06/22/22 (!) 144/96  02/27/22 110/80     Constitutional:  Body mass index is 32.06 kg/m., not in acute distress, anxious state of mind, disorganized thoughts Eyes: PERRLA, EOMI, no exophthalmos ENT: moist mucous membranes, no thyromegaly R>L, no cervical lymphadenopathy Cardiovascular: normal precordial activity, RRR, no murmur/rubs/gallops Respiratory:  adequate breathing efforts, no gross chest deformity, Clear to auscultation bilaterally Gastrointestinal: abdomen soft, non-tender, no distension, bowel sounds present Musculoskeletal: no gross deformities, strength intact in all four extremities Skin: moist, warm, no rashes Neurological: no tremor with outstretched hands, deep tendon reflexes normal in BLE.   CMP ( most recent) CMP     Component Value Date/Time   NA 139 06/22/2022 1505   NA 140 03/08/2013 1335   K 3.6 06/22/2022 1505   K 3.8 03/08/2013 1335   CL 108 06/22/2022 1505   CL 107 03/08/2013 1335   CO2 22 06/22/2022 1505   CO2 25 03/08/2013 1335   GLUCOSE 97 06/22/2022 1505   GLUCOSE 97 03/08/2013 1335   BUN 13 06/22/2022 1505   BUN 8 03/08/2013 1335   CREATININE 0.64 06/22/2022 1505   CREATININE 0.68 03/08/2013 1335   CALCIUM 9.8 06/22/2022 1505   CALCIUM 9.0 03/08/2013 1335   PROT 7.6 06/22/2022 1530   ALBUMIN 4.5 06/22/2022 1530   AST 38 06/22/2022 1530   ALT 16 06/22/2022 1530   ALKPHOS 59 06/22/2022 1530   BILITOT 0.9 06/22/2022 1530   GFRNONAA >60  06/22/2022 1505   GFRNONAA >60 03/08/2013 1335   GFRAA >60 09/06/2015 1104   GFRAA >60 03/08/2013 1335     Diabetic Labs (most recent): No results found for: "HGBA1C", "MICROALBUR"   Lipid Panel ( most recent) Lipid Panel  No results found for: "CHOL", "TRIG", "HDL", "CHOLHDL", "VLDL", "LDLCALC", "LDLDIRECT", "LABVLDL"     No results found for: "TSH", "FREET4"  ------------------------------------------------------------------------------------------------------------ Thyroid US from 03/30/22 Indication: Anterior neck pain  Comparison: Neck Ultrasound on 01/29/2015  Technique: Gray-scale and color Doppler images of the neck were obtained.  Findings: THYROID: The right lobe of the thyroid measures 4.9 x 1.8 x 1.3 cm. The left lobe of the thyroid measures 3.5 x 1.6 x 1.2 cm. The isthmus measures 0.7 cm in AP depth.  Echotexture of the thyroid is homogeneous.  There is a solitary left-sided 0.6 x 0.4 x 0.6 cm hypoechoic nodule in the lower pole.  LYMPH NODES: Right neck level II 1.6 x 0.9 x 1.2 cm Left neck level III 0.9 x 0.5 x 1.0 cm Left neck level III 0.9 x 0.5 x 1.1 cm Left neck level III 1.4 x 0.8 x 1.1 cm Left neck level IV 1.0 x 0.5 x 0.5 cm  Impression: Homogeneous thyroid gland which is not enlarged.  There is a left-sided 0.6 cm thyroid nodule which is hypoechoic. It is too small to warrant biopsy. Thyroid nodule not reported previously on ultrasound dated 01/29/2015. Consider follow up ultrasound in one year to reassess for growth or change.  Bilateral lymphadenopathy present. Lymph nodes are unremarkable appearing and stable from last ultrasound in 2016. Exam End: 03/30/22 14:59 Last Resulted: 04/01/22 13:16  Received From: [place]:   ASSESSMENT / PLAN:  1. Hypothyroidism-unspecified (likely autoimmune related due to strong family history)  - Will check thyroid tests before next visit:  TSH, free T4, free  T3, and check antibodies to help classify her dysfunction.  She reports worsening symptoms after taking the Levothyroxine 50 mcg therefore she stopped it.  Due to her sensitivity to medications, she may tolerate branded Synthroid or Tirosint better, and would recommend starting at lowest dose to assess tolerance.  Will discuss further after labs return.  -Due to recent changes in symptoms and development of mild compressive symptoms, will go ahead and order repeat thyroid ultrasound to reassess the nodule seen previously back in July.      - Time spent with the patient: 60 minutes, of which >50% was spent in obtaining information about her symptoms, reviewing her previous labs, evaluations, and treatments, counseling her about her hypothyroidism, and developing a plan to confirm the diagnosis and long term treatment as necessary. Please refer to "Patient Self Inventory" in the Media tab for reviewed elements of pertinent patient history.  Burman Blacksmith Thumma participated in the discussions, expressed understanding, and voiced agreement with the above plans.  All questions were answered to her satisfaction. she is encouraged to contact clinic should she have any questions or concerns prior to her return visit.   FOLLOW UP PLAN:  Return in about 6 weeks (around 09/01/2022) for Thyroid follow up, Previsit labs, thyroid ultrasound.  Rayetta Pigg, [facility] [address] [place], [place] [address] Phone: [phone] Fax: [phone]  07/21/2022, 3:55 PM

## 2022-07-22 LAB — TSH: TSH: 15.9 u[IU]/mL — ABNORMAL HIGH (ref 0.450–4.500)

## 2022-07-22 LAB — T4, FREE: Free T4: 0.65 ng/dL — ABNORMAL LOW (ref 0.82–1.77)

## 2022-07-22 LAB — THYROGLOBULIN ANTIBODY: Thyroglobulin Antibody: 153.8 IU/mL — ABNORMAL HIGH (ref 0.0–0.9)

## 2022-07-22 LAB — T3, FREE: T3, Free: 2.3 pg/mL (ref 2.0–4.4)

## 2022-07-22 LAB — THYROID PEROXIDASE ANTIBODY: Thyroperoxidase Ab SerPl-aCnc: 360 IU/mL — ABNORMAL HIGH (ref 0–34)

## 2022-07-24 ENCOUNTER — Other Ambulatory Visit: Payer: Self-pay

## 2022-07-27 ENCOUNTER — Other Ambulatory Visit: Payer: Self-pay

## 2022-07-27 ENCOUNTER — Ambulatory Visit
Admission: RE | Admit: 2022-07-27 | Discharge: 2022-07-27 | Disposition: A | Discharge status: Home / Self Care | Payer: 59 | Source: Ambulatory Visit | Attending: Nurse Practitioner | Admitting: Nurse Practitioner

## 2022-07-27 DIAGNOSIS — E039 Hypothyroidism, unspecified: Secondary | ICD-10-CM | POA: Insufficient documentation

## 2022-07-27 DIAGNOSIS — R7989 Other specified abnormal findings of blood chemistry: Secondary | ICD-10-CM | POA: Insufficient documentation

## 2022-07-28 ENCOUNTER — Telehealth: Payer: Self-pay | Admitting: Nurse Practitioner

## 2022-07-28 NOTE — Telephone Encounter (Signed)
I didn't know how long it would take for her to get the ultrasound since it was holiday season, therefore I scheduled it further out, just in case.  I have both her labs and ultrasound so we can move up her appointment to talk about it face-to-face.

## 2022-07-28 NOTE — Telephone Encounter (Addendum)
Pt called and asked if you would look at her results she did and let her know if she is going to be put on thyroid replacement. Was patient suppose to do labs this soon? Her appt is not until January. Thanks

## 2022-07-29 ENCOUNTER — Encounter: Payer: Self-pay | Admitting: Nurse Practitioner

## 2022-07-29 ENCOUNTER — Other Ambulatory Visit: Payer: Self-pay

## 2022-07-29 ENCOUNTER — Ambulatory Visit: Payer: 59 | Admitting: Nurse Practitioner

## 2022-07-29 VITALS — BP 156/101 | HR 77 | Ht 63.0 in | Wt 180.2 lb

## 2022-07-29 DIAGNOSIS — E063 Autoimmune thyroiditis: Secondary | ICD-10-CM | POA: Diagnosis not present

## 2022-07-29 DIAGNOSIS — R599 Enlarged lymph nodes, unspecified: Secondary | ICD-10-CM | POA: Diagnosis not present

## 2022-07-29 DIAGNOSIS — E038 Other specified hypothyroidism: Secondary | ICD-10-CM

## 2022-07-29 MED ORDER — LEVOTHYROXINE SODIUM 25 MCG PO TABS
25.0000 ug | ORAL_TABLET | Freq: Every day | ORAL | 0 refills | Status: DC
  Filled 2022-07-29: qty 30, 30d supply, fill #0

## 2022-07-29 NOTE — Progress Notes (Signed)
Endocrinology Consult Note                                         07/29/2022, 11:37 AM  Subjective:   Subjective    Jennifer Mclaughlin is a 51 y.o.-year-old female patient being seen in consultation for hypothyroidism referred by Idelle Crouch, MD.   Past Medical History:  Diagnosis Date   Anemia    Hypertension    Hypothyroidism     Past Surgical History:  Procedure Laterality Date   CHOLECYSTECTOMY     NASAL SINUS SURGERY     TONSILLECTOMY     TUBAL LIGATION      Social History   Socioeconomic History   Marital status: Single    Spouse name: Not on file   Number of children: Not on file   Years of education: Not on file   Highest education level: Not on file  Occupational History   Not on file  Tobacco Use   Smoking status: Former   Smokeless tobacco: Never  Substance and Sexual Activity   Alcohol use: Yes    Alcohol/week: 0.0 standard drinks of alcohol    Comment: occas   Drug use: No   Sexual activity: Not on file  Other Topics Concern   Not on file  Social History Narrative   Not on file   Social Determinants of Health   Financial Resource Strain: Not on file  Food Insecurity: Not on file  Transportation Needs: Not on file  Physical Activity: Not on file  Stress: Not on file  Social Connections: Not on file    Family History  Problem Relation Age of Onset   Thyroid cancer Father    Breast cancer Neg Hx     Outpatient Encounter Medications as of 07/29/2022  Medication Sig   ALPRAZolam (XANAX) 0.5 MG tablet TAKE 1 TABLET BY MOUTH NIGHTLY AS NEEDED FOR SLEEP   cetirizine (ZYRTEC) 10 MG tablet Take 10 mg by mouth daily.   cyanocobalamin (,VITAMIN B-12,) 1000 MCG/ML injection Inject 1 mL (1,000 mcg total) into the muscle every 14 (fourteen) days   hydrALAZINE (APRESOLINE) 10 MG tablet Take 1 tablet (10 mg total) by mouth 3 (three) times daily as needed (For blood pressure  greater than 150/90)   levothyroxine (SYNTHROID) 25 MCG tablet Take 1 tablet (25 mcg total) by mouth daily.   omeprazole (PRILOSEC) 20 MG capsule TAKE 1 CAPSULE (20 MG TOTAL) BY MOUTH ONCE DAILY   cyanocobalamin (VITAMIN B12) 1000 MCG/ML injection Inject 1 mL (1,000 mcg total) into the muscle every 14 (fourteen) days (Patient not taking: Reported on 07/21/2022)   No facility-administered encounter medications on file as of 07/29/2022.    ALLERGIES: Allergies  Allergen Reactions   Biaxin [Clarithromycin] Swelling   Ceftin [Cefuroxime Axetil] Swelling   Penicillins Hives   Septra [Sulfamethoxazole-Trimethoprim] Swelling   Sulfa Antibiotics Swelling    Other reaction(s): Unknown   Amoxicillin     Other reaction(s): Unknown   Dicyclomine  Other reaction(s): Other (See Comments) Flushing   Hydrochlorothiazide     Other reaction(s): Other (See Comments) Swollen tongue   Linaclotide Diarrhea    Flushing   Nebivolol     Other reaction(s): Unknown   Telmisartan     Other reaction(s): Unknown   VACCINATION STATUS:  There is no immunization history on file for this patient.   HPI   Jennifer Mclaughlin is a patient with the above medical history. she was diagnosed with problems with her thyroid dating as far back as 2016.  She notes during pregnancy, she had a ?nodule removed as it flared up (no history of such surgery was available to review).  She notes her thyroid has fluctuated between hypoactive and normal for years but reports most recently her symptoms have worsened and become more consistent.  She was recently started on Levothyroxine 50 mcg po daily by her PCP but reports she felt terrible after taking it, says it caused her heart to race.  She notes she is sensitive to medications in general.     I reviewed patient's thyroid tests:  Lab Results  Component Value Date   TSH 15.900 (H) 07/21/2022   FREET4 0.65 (L) 07/21/2022     SEE care everywhere for recent thyroid  labs.  Pt describes: - weight gain despite eating healthier - fatigue - cold intolerance - mood swings - dry skin - hair loss - generalized edema  Pt denies feeling nodules in neck, hoarseness, dysphagia/odynophagia, SOB with lying down.  she does have strong family history of thyroid disorders in her cousins, paternal aunts.  Her father did have thyroid cancer requiring surgical removal.  No history of radiation therapy to head or neck.  No recent use of iodine supplements.  Denies use of Biotin containing supplements.  I reviewed her chart and she also has a history of pernicious anemia, GERD, HTN, IBS, HLD.   ROS:  Constitutional: + weight gain, + fatigue, fluctuating hot/cold spells Eyes: no blurry vision, no xerophthalmia ENT: no sore throat, no nodules palpated in throat, + intermittent dysphagia/odynophagia, no hoarseness Cardiovascular: no chest pain, no SOB, no palpitations, + generalized swelling (mostly in right hand and left foot) Respiratory: no cough, no SOB Gastrointestinal: no nausea/vomiting/diarrhea Musculoskeletal: no muscle/joint aches Skin: no rashes, + hair loss Neurological: no tremors, no numbness, no tingling, no dizziness Psychiatric: + mood swings   Objective:   Objective     BP (!) 156/101 (BP Location: Right Arm, Patient Position: Sitting, Cuff Size: Large)   Pulse 77   Ht '5\' 3"'$  (1.6 m)   Wt 180 lb 3.2 oz (81.7 kg)   BMI 31.92 kg/m  Wt Readings from Last 3 Encounters:  07/29/22 180 lb 3.2 oz (81.7 kg)  07/21/22 181 lb (82.1 kg)  06/22/22 168 lb (76.2 kg)    BP Readings from Last 3 Encounters:  07/29/22 (!) 156/101  07/21/22 (!) 144/93  06/22/22 (!) 144/96      Physical Exam- Limited  Constitutional:  Body mass index is 31.92 kg/m. , not in acute distress, normal state of mind Eyes:  EOMI, no exophthalmos Neck: Supple, no lymphadenopathy appreciated Thyroid: No gross goiter R>L Cardiovascular: RRR, no murmurs, rubs, or  gallops, no edema Respiratory: Adequate breathing efforts, no crackles, rales, rhonchi, or wheezing Musculoskeletal: no gross deformities, strength intact in all four extremities, no gross restriction of joint movements Skin:  no rashes, no hyperemia Neurological: no tremor with outstretched hands   CMP ( most recent) CMP  Component Value Date/Time   NA 139 06/22/2022 1505   NA 140 03/08/2013 1335   K 3.6 06/22/2022 1505   K 3.8 03/08/2013 1335   CL 108 06/22/2022 1505   CL 107 03/08/2013 1335   CO2 22 06/22/2022 1505   CO2 25 03/08/2013 1335   GLUCOSE 97 06/22/2022 1505   GLUCOSE 97 03/08/2013 1335   BUN 13 06/22/2022 1505   BUN 8 03/08/2013 1335   CREATININE 0.64 06/22/2022 1505   CREATININE 0.68 03/08/2013 1335   CALCIUM 9.8 06/22/2022 1505   CALCIUM 9.0 03/08/2013 1335   PROT 7.6 06/22/2022 1530   ALBUMIN 4.5 06/22/2022 1530   AST 38 06/22/2022 1530   ALT 16 06/22/2022 1530   ALKPHOS 59 06/22/2022 1530   BILITOT 0.9 06/22/2022 1530   GFRNONAA >60 06/22/2022 1505   GFRNONAA >60 03/08/2013 1335   GFRAA >60 09/06/2015 1104   GFRAA >60 03/08/2013 1335     Diabetic Labs (most recent): No results found for: "HGBA1C", "MICROALBUR"   Lipid Panel ( most recent) Lipid Panel  No results found for: "CHOL", "TRIG", "HDL", "CHOLHDL", "VLDL", "LDLCALC", "LDLDIRECT", "LABVLDL"     Lab Results  Component Value Date   TSH 15.900 (H) 07/21/2022   FREET4 0.65 (L) 07/21/2022    ------------------------------------------------------------------------------------------------------------ Thyroid US from 03/30/22 Indication: Anterior neck pain  Comparison: Neck Ultrasound on 01/29/2015  Technique: Gray-scale and color Doppler images of the neck were obtained.  Findings: THYROID: The right lobe of the thyroid measures 4.9 x 1.8 x 1.3 cm. The left lobe of the thyroid measures 3.5 x 1.6 x 1.2 cm. The isthmus measures 0.7 cm in AP depth.  Echotexture of the thyroid is  homogeneous.  There is a solitary left-sided 0.6 x 0.4 x 0.6 cm hypoechoic nodule in the lower pole.  LYMPH NODES: Right neck level II 1.6 x 0.9 x 1.2 cm Left neck level III 0.9 x 0.5 x 1.0 cm Left neck level III 0.9 x 0.5 x 1.1 cm Left neck level III 1.4 x 0.8 x 1.1 cm Left neck level IV 1.0 x 0.5 x 0.5 cm  Impression: Homogeneous thyroid gland which is not enlarged.  There is a left-sided 0.6 cm thyroid nodule which is hypoechoic. It is too small to warrant biopsy. Thyroid nodule not reported previously on ultrasound dated 01/29/2015. Consider follow up ultrasound in one year to reassess for growth or change.  Bilateral lymphadenopathy present. Lymph nodes are unremarkable appearing and stable from last ultrasound in 2016. Exam End: 03/30/22 14:59 Last Resulted: 04/01/22 13:16  Received From: [facility]      Latest Reference Range & Units 07/21/22 15:16  TSH 0.450 - 4.500 uIU/mL 15.900 (H)  Triiodothyronine,Free,Serum 2.0 - 4.4 pg/mL 2.3  T4,Free(Direct) 0.82 - 1.77 ng/dL 0.65 (L)  Thyroperoxidase Ab SerPl-aCnc 0 - 34 IU/mL 360 (H)  Thyroglobulin Antibody 0.0 - 0.9 IU/mL 153.8 (H)  (H): Data is abnormally high (L): Data is abnormally low  Thyroid US from 07/27/22 CLINICAL DATA:  51 year old female with a history family thyroid cancer   EXAM: THYROID ULTRASOUND   TECHNIQUE: Ultrasound examination of the thyroid gland and adjacent soft tissues was performed.   COMPARISON:  12/13/2007   FINDINGS: Parenchymal Echotexture: Mildly heterogenous   Isthmus: 0.5 cm   Right lobe: 4.1 cm x 1.1 cm x 1.4 cm   Left lobe: 3.4 cm x 1.1 cm x 1.4 cm   _________________________________________________________   Estimated total number of nodules >/= 1 cm: 0  Number of spongiform nodules >/=  2 cm not described below (TR1): 0   Number of mixed cystic and solid nodules >/= 1.5 cm not described below ([place]): 0    _________________________________________________________   Nodule # 1:   Location: Left; Inferior   Maximum size: 0.7 cm; Other 2 dimensions:  0.5 cm x 0.6 cm   Composition: cannot determine (2)   Echogenicity: hypoechoic (2)   Shape: taller-than-wide (3)   Margins: ill-defined (0)   Echogenic foci: none (0)   ACR TI-RADS total points: 6.   ACR TI-RADS risk category: TR4 (4-6 points).   ACR TI-RADS recommendations:   Nodule does not meet criteria for surveillance   _________________________________________________________   There are borderline enlarged lymph nodes in the left neck, with questionable loss of the normal ratio of height-with.   IMPRESSION: No thyroid nodule meets criteria for biopsy or surveillance, as designated by the newly established ACR TI-RADS criteria.   Recommendations follow those established by the new ACR TI-RADS criteria (J Am Coll Radiol 6503;54:656-812).   There are questionable borderline enlarged lymph nodes of the left neck. These are nonspecific and may be reactive. Correlation with any history of inflammation/infection or any known malignancy may be useful, and if these persist, further evaluation with contrast-enhanced neck CT may be useful.     Electronically Signed   By: Corrie Mckusick D.O.   On: 07/27/2022 17:04  Assessment & Plan:   ASSESSMENT / PLAN:  1. Hypothyroidism-r/t Hashimotos thyroiditis  - Her repeat thyroid function tests show positive antibodies, confirming autoimmune etiology.  She is in the underactive thyroid category with high TSH and low Free T4.  Will start her on low dose Levothyroxine 25 mcg po daily before breakfast.  I told her we would look at changing her to branded Synthroid or Tirosint on subsequent visits if she does not tolerate the generic form.  Will repeat thyroid function tests in 8 weeks to assess response to treatment and adjust dose further if needed.   - The correct intake of thyroid  hormone (Levothyroxine, Synthroid), is on empty stomach first thing in the morning, with water, separated by at least 30 minutes from breakfast and other medications,  and separated by more than 4 hours from calcium, iron, multivitamins, acid reflux medications (PPIs).  - This medication is a life-long medication and will be needed to correct thyroid hormone imbalances for the rest of your life.  The dose may change from time to time, based on thyroid blood work.  - It is extremely important to be consistent taking this medication, near the same time each morning.  -AVOID TAKING PRODUCTS CONTAINING BIOTIN (commonly found in Hair, Skin, Nails vitamins) AS IT INTERFERES WITH THE VALIDITY OF THYROID FUNCTION BLOOD TESTS.   She did her ultrasound which shows small 0.7 cm nodule in left lower lobe of thyroid gland, shows mild heterogeneous tissue and overall normal sized thyroid.  They did incidentally notice an enlarged lymph node in left neck which warranted further follow up with CT with contrast.  She denies any recent infections or trauma.  I did order this today.    I spent 35 minutes in the care of the patient today including review of labs from Thyroid Function, CMP, and other relevant labs ; imaging/biopsy records (current and previous including abstractions from other facilities); face-to-face time discussing  her lab results and symptoms, medications doses, her options of short and long term treatment based on the latest standards of care /  guidelines;   and documenting the encounter.  Burman Blacksmith Driscoll  participated in the discussions, expressed understanding, and voiced agreement with the above plans.  All questions were answered to her satisfaction. she is encouraged to contact clinic should she have any questions or concerns prior to her return visit.   FOLLOW UP PLAN:  Return in about 8 weeks (around 09/23/2022) for Thyroid follow up, Previsit labs.  Rayetta Pigg, [facility]  [facility] [address] [place], [place] [address] Phone: [phone] Fax: [phone]  07/29/2022, 11:37 AM

## 2022-07-30 ENCOUNTER — Other Ambulatory Visit: Payer: Self-pay | Admitting: Nurse Practitioner

## 2022-07-30 DIAGNOSIS — R599 Enlarged lymph nodes, unspecified: Secondary | ICD-10-CM

## 2022-07-31 ENCOUNTER — Telehealth: Payer: Self-pay

## 2022-07-31 NOTE — Telephone Encounter (Signed)
Has she started the thyroid pill yet?  Wonder if it is an allergic reaction

## 2022-07-31 NOTE — Telephone Encounter (Signed)
As long as it is not causing trouble breathing or difficulty swallowing, tell her to hold tight.  The CT scan I ordered can provide more answers for that.  She needs to go the the ED if she has trouble breathing or swallowing.

## 2022-07-31 NOTE — Telephone Encounter (Signed)
Patient left a Vm stating that she has feels a lump in her throat and is not sure what she should do. Please advise

## 2022-08-04 ENCOUNTER — Other Ambulatory Visit: Payer: Self-pay

## 2022-08-04 ENCOUNTER — Ambulatory Visit
Admission: RE | Admit: 2022-08-04 | Discharge: 2022-08-04 | Disposition: A | Discharge status: Home / Self Care | Payer: 59 | Source: Ambulatory Visit | Attending: Nurse Practitioner | Admitting: Nurse Practitioner

## 2022-08-04 DIAGNOSIS — R599 Enlarged lymph nodes, unspecified: Secondary | ICD-10-CM

## 2022-08-04 MED ORDER — IOPAMIDOL (ISOVUE-300) INJECTION 61%
75.0000 mL | Freq: Once | INTRAVENOUS | Status: AC | PRN
  Administered 2022-08-04: 75 mL via INTRAVENOUS

## 2022-08-05 ENCOUNTER — Other Ambulatory Visit: Payer: Self-pay

## 2022-08-05 MED ORDER — OMEPRAZOLE 20 MG PO CPDR
20.0000 mg | DELAYED_RELEASE_CAPSULE | Freq: Every day | ORAL | 3 refills | Status: DC
  Filled 2022-08-05 – 2022-08-13 (×2): qty 90, 90d supply, fill #0
  Filled 2023-01-06: qty 90, 90d supply, fill #1
  Filled 2023-04-07: qty 90, 90d supply, fill #2
  Filled 2023-07-08: qty 90, 90d supply, fill #3

## 2022-08-06 ENCOUNTER — Other Ambulatory Visit: Payer: Self-pay

## 2022-08-06 ENCOUNTER — Telehealth: Payer: Self-pay | Admitting: *Deleted

## 2022-08-06 ENCOUNTER — Other Ambulatory Visit: Payer: Self-pay | Admitting: Nurse Practitioner

## 2022-08-06 DIAGNOSIS — R599 Enlarged lymph nodes, unspecified: Secondary | ICD-10-CM

## 2022-08-06 MED ORDER — SYNTHROID 25 MCG PO TABS
25.0000 ug | ORAL_TABLET | Freq: Every day | ORAL | 3 refills | Status: DC
  Filled 2022-08-06 (×2): qty 30, 30d supply, fill #0

## 2022-08-06 NOTE — Telephone Encounter (Signed)
Patient was called and given the results from [place]. Patient shares that this morning she rec'd a call from Dr. Ileene Hutchinson office  who is a ENT. She has an appointment for 08/26/2022 for further evaluation.  Patient ask about her thyroid medication. She said Loree Fee was waiting on the CT before putting her on something.. Note patient was sent to the ED last Friday as she was having a reaction to Levothroid. She is currently on Prednisone 20 mg and has 3 more days of treatment .

## 2022-08-06 NOTE — Telephone Encounter (Signed)
Patient was called. She states that the rash did go away when she stopped the Levothyoxine. She has two more days of Prenisone. Patient is willing to try the Synthroid (brand)  Does she wait till she has topped the Prednisone? Uses [facility]

## 2022-08-06 NOTE — Telephone Encounter (Signed)
Patient was called and made aware. 

## 2022-08-06 NOTE — Telephone Encounter (Signed)
Patient called back- she is asking if the Hashimoto's could be attacking her and this is the reason she is having the trouble with her results ?

## 2022-08-06 NOTE — Telephone Encounter (Signed)
-----   Message from Brita Romp, NP sent at 08/06/2022  7:39 AM EST ----- CT results show some abnormalities (questionable lymph node enlargement vs parotid tumor-none of which are my specialty) which recommend referral to ENT for further evaluation which I can put in for her.

## 2022-08-06 NOTE — Telephone Encounter (Signed)
No, the lymph nodes/parotid issue has nothing to do with her thyroid condition.    Side note, did the rash go away when she stopped the Levothyroxine?  Is she willing to try the branded-Synthroid to get the thyroid hormone that she needs?

## 2022-08-06 NOTE — Progress Notes (Signed)
CT results show some abnormalities (questionable lymph node enlargement vs parotid tumor-none of which are my specialty) which recommend referral to ENT for further evaluation which I can put in for her.

## 2022-08-06 NOTE — Addendum Note (Signed)
Addended by: Brita Romp on: 08/06/2022 04:42 PM   Modules accepted: Orders

## 2022-08-06 NOTE — Telephone Encounter (Signed)
No, she can go ahead and start once she picks it up. Ill send in the script.

## 2022-08-07 ENCOUNTER — Other Ambulatory Visit: Payer: Self-pay

## 2022-08-07 ENCOUNTER — Other Ambulatory Visit: Payer: Self-pay | Admitting: *Deleted

## 2022-08-07 DIAGNOSIS — E039 Hypothyroidism, unspecified: Secondary | ICD-10-CM

## 2022-08-07 MED ORDER — SYNTHROID 25 MCG PO TABS: 25.0000 ug | ORAL_TABLET | Freq: Every day | ORAL | 3 refills | Status: DC

## 2022-08-07 NOTE — Telephone Encounter (Signed)
This has been sent to the patient's requested pharmacy- [facility] [address]

## 2022-08-07 NOTE — Telephone Encounter (Signed)
Pt would like [facility] [place]

## 2022-08-07 NOTE — Telephone Encounter (Signed)
This has been sent to patient's requested pharmacy.

## 2022-08-11 ENCOUNTER — Other Ambulatory Visit: Payer: 59

## 2022-08-13 ENCOUNTER — Other Ambulatory Visit: Payer: Self-pay

## 2022-08-13 MED ORDER — SYNTHROID 25 MCG PO TABS
25.0000 ug | ORAL_TABLET | Freq: Every day | ORAL | 3 refills | Status: DC
  Filled 2022-08-13: qty 30, 30d supply, fill #0
  Filled 2022-09-08: qty 30, 30d supply, fill #1

## 2022-08-14 ENCOUNTER — Other Ambulatory Visit: Payer: Self-pay

## 2022-08-20 ENCOUNTER — Other Ambulatory Visit: Payer: Self-pay

## 2022-08-26 ENCOUNTER — Other Ambulatory Visit: Payer: Self-pay

## 2022-08-26 MED ORDER — CYANOCOBALAMIN 1000 MCG/ML IJ SOLN
1000.0000 ug | INTRAMUSCULAR | 5 refills | Status: DC
  Filled 2022-08-26: qty 2, 28d supply, fill #0
  Filled 2022-09-08 – 2022-09-17 (×2): qty 2, 28d supply, fill #1
  Filled 2022-10-09: qty 2, 28d supply, fill #2
  Filled 2022-10-18 – 2022-11-17 (×3): qty 2, 28d supply, fill #3
  Filled 2022-11-30 – 2022-12-09 (×2): qty 2, 28d supply, fill #4
  Filled 2023-01-06: qty 2, 28d supply, fill #5

## 2022-08-27 ENCOUNTER — Other Ambulatory Visit: Payer: Self-pay

## 2022-08-27 ENCOUNTER — Encounter: Payer: Self-pay | Admitting: *Deleted

## 2022-08-28 ENCOUNTER — Ambulatory Visit
Admission: RE | Admit: 2022-08-28 | Discharge: 2022-08-28 | Disposition: A | Discharge status: Home / Self Care | Payer: 59 | Attending: Gastroenterology | Admitting: Gastroenterology

## 2022-08-28 ENCOUNTER — Encounter
Admission: RE | Disposition: A | Discharge status: Home / Self Care | Payer: Self-pay | Source: Home / Self Care | Attending: Gastroenterology

## 2022-08-28 ENCOUNTER — Ambulatory Visit: Payer: 59 | Admitting: Registered Nurse

## 2022-08-28 ENCOUNTER — Encounter: Payer: Self-pay | Admitting: *Deleted

## 2022-08-28 DIAGNOSIS — K641 Second degree hemorrhoids: Secondary | ICD-10-CM | POA: Insufficient documentation

## 2022-08-28 DIAGNOSIS — E039 Hypothyroidism, unspecified: Secondary | ICD-10-CM | POA: Diagnosis not present

## 2022-08-28 DIAGNOSIS — Z1211 Encounter for screening for malignant neoplasm of colon: Secondary | ICD-10-CM | POA: Insufficient documentation

## 2022-08-28 DIAGNOSIS — Z9049 Acquired absence of other specified parts of digestive tract: Secondary | ICD-10-CM | POA: Diagnosis not present

## 2022-08-28 DIAGNOSIS — K219 Gastro-esophageal reflux disease without esophagitis: Secondary | ICD-10-CM | POA: Diagnosis not present

## 2022-08-28 DIAGNOSIS — I10 Essential (primary) hypertension: Secondary | ICD-10-CM | POA: Diagnosis not present

## 2022-08-28 DIAGNOSIS — K589 Irritable bowel syndrome without diarrhea: Secondary | ICD-10-CM | POA: Insufficient documentation

## 2022-08-28 DIAGNOSIS — D759 Disease of blood and blood-forming organs, unspecified: Secondary | ICD-10-CM | POA: Insufficient documentation

## 2022-08-28 DIAGNOSIS — D649 Anemia, unspecified: Secondary | ICD-10-CM | POA: Insufficient documentation

## 2022-08-28 DIAGNOSIS — K317 Polyp of stomach and duodenum: Secondary | ICD-10-CM | POA: Insufficient documentation

## 2022-08-28 HISTORY — PX: ESOPHAGOGASTRODUODENOSCOPY (EGD) WITH PROPOFOL: SHX5813

## 2022-08-28 HISTORY — DX: Autoimmune thyroiditis: E06.3

## 2022-08-28 HISTORY — PX: COLONOSCOPY WITH PROPOFOL: SHX5780

## 2022-08-28 SURGERY — COLONOSCOPY WITH PROPOFOL
Anesthesia: General

## 2022-08-28 MED ORDER — SODIUM CHLORIDE 0.9 % IV SOLN: INTRAVENOUS | Status: DC

## 2022-08-28 MED ORDER — LIDOCAINE HCL (CARDIAC) PF 100 MG/5ML IV SOSY
PREFILLED_SYRINGE | INTRAVENOUS | Status: DC | PRN
  Administered 2022-08-28: 100 mg via INTRAVENOUS

## 2022-08-28 MED ORDER — GLYCOPYRROLATE 0.2 MG/ML IJ SOLN
INTRAMUSCULAR | Status: DC | PRN
  Administered 2022-08-28: .2 mg via INTRAVENOUS

## 2022-08-28 MED ORDER — DEXMEDETOMIDINE HCL 200 MCG/2ML IV SOLN
INTRAVENOUS | Status: DC | PRN
  Administered 2022-08-28: 8 ug via INTRAVENOUS

## 2022-08-28 MED ORDER — PHENYLEPHRINE HCL (PRESSORS) 10 MG/ML IV SOLN
INTRAVENOUS | Status: DC | PRN
  Administered 2022-08-28 (×2): 160 ug via INTRAVENOUS

## 2022-08-28 MED ORDER — PROPOFOL 500 MG/50ML IV EMUL
INTRAVENOUS | Status: DC | PRN
  Administered 2022-08-28: 195.167 ug/kg/min via INTRAVENOUS

## 2022-08-28 MED ORDER — PROPOFOL 10 MG/ML IV BOLUS
INTRAVENOUS | Status: DC | PRN
  Administered 2022-08-28: 90 mg via INTRAVENOUS

## 2022-08-28 MED ORDER — STERILE WATER FOR IRRIGATION IR SOLN
Status: DC | PRN
  Administered 2022-08-28: 60 mL

## 2022-08-28 NOTE — Anesthesia Postprocedure Evaluation (Signed)
Anesthesia Post Note  Patient: Jennifer Mclaughlin  Procedure(s) Performed: COLONOSCOPY WITH PROPOFOL ESOPHAGOGASTRODUODENOSCOPY (EGD) WITH PROPOFOL  Patient location during evaluation: PACU Anesthesia Type: General Level of consciousness: awake and awake and alert Pain management: pain level controlled Vital Signs Assessment: post-procedure vital signs reviewed and stable Respiratory status: spontaneous breathing and nonlabored ventilation Cardiovascular status: stable Anesthetic complications: no  No notable events documented.   Last Vitals:  Vitals:   08/28/22 0857 08/28/22 0907  BP: 94/66 100/77  Pulse:    Resp:    Temp: (!) 35.7 C   SpO2:      Last Pain:  Vitals:   08/28/22 0907  TempSrc:   PainSc: 0-No pain                 VAN STAVEREN,Amilah Greenspan

## 2022-08-28 NOTE — Interval H&P Note (Signed)
History and Physical Interval Note:  08/28/2022 8:26 AM  Jennifer Mclaughlin  has presented today for surgery, with the diagnosis of [place].  The various methods of treatment have been discussed with the patient and family. After consideration of risks, benefits and other options for treatment, the patient has consented to  Procedure(s): COLONOSCOPY WITH PROPOFOL (N/A) ESOPHAGOGASTRODUODENOSCOPY (EGD) WITH PROPOFOL (N/A) as a surgical intervention.  The patient's history has been reviewed, patient examined, no change in status, stable for surgery.  I have reviewed the patient's chart and labs.  Questions were answered to the patient's satisfaction.     Lesly Rubenstein  Ok to proceed with EGD/Colonoscopy

## 2022-08-28 NOTE — H&P (Signed)
Outpatient short stay form Pre-procedure 08/28/2022  Lesly Rubenstein, MD  Primary Physician: Idelle Crouch, MD  Reason for visit:  GERD/Screening  History of present illness:    51 y/o lady with history of IBS, hypothyroidism, and GERD here for EGD/Colonoscopy for GERD and screening colonoscopy. She does have intermittent small volume rectal bleeding but does not sound clinically significant. History of cholecystectomy. No first degree relatives with GI malignancies. No blood thinners.    Current Facility-Administered Medications:    0.9 %  sodium chloride infusion, , Intravenous, Continuous, Danikah Budzik, Hilton Cork, MD, Last Rate: 20 mL/hr at 08/28/22 0814, New Bag at 08/28/22 0814  Medications Prior to Admission  Medication Sig Dispense Refill Last Dose   SYNTHROID 25 MCG tablet Take 1 tablet (25 mcg total) by mouth daily before breakfast. 30 tablet 3 08/28/2022 at 0500   ALPRAZolam (XANAX) 0.5 MG tablet TAKE 1 TABLET BY MOUTH NIGHTLY AS NEEDED FOR SLEEP 30 tablet 5    cetirizine (ZYRTEC) 10 MG tablet Take 10 mg by mouth daily.      cyanocobalamin (,VITAMIN B-12,) 1000 MCG/ML injection Inject 1 mL (1,000 mcg total) into the muscle every 14 (fourteen) days 1 mL 0    cyanocobalamin (VITAMIN B12) 1000 MCG/ML injection Inject 1 mL (1,000 mcg total) into the muscle every 14 (fourteen) days 2 mL 5    omeprazole (PRILOSEC) 20 MG capsule TAKE 1 CAPSULE (20 MG TOTAL) BY MOUTH ONCE DAILY 30 capsule 1    omeprazole (PRILOSEC) 20 MG capsule Take 1 capsule (20 mg total) by mouth daily. 90 capsule 3    SYNTHROID 25 MCG tablet Take 1 tablet (25 mcg total) by mouth daily before breakfast. 30 tablet 3      Allergies  Allergen Reactions   Biaxin [Clarithromycin] Swelling   Ceftin [Cefuroxime Axetil] Swelling   Penicillins Hives   Septra [Sulfamethoxazole-Trimethoprim] Swelling   Sulfa Antibiotics Swelling    Other reaction(s): Unknown   Amoxicillin     Other reaction(s): Unknown    Dicyclomine     Other reaction(s): Other (See Comments) Flushing   Hydrochlorothiazide     Other reaction(s): Other (See Comments) Swollen tongue   Linaclotide Diarrhea    Flushing   Nebivolol     Other reaction(s): Unknown   Telmisartan     Other reaction(s): Unknown   Levothyroxine Sodium Rash     Past Medical History:  Diagnosis Date   Anemia    Hashimoto's disease    Hypertension    Hypothyroidism     Review of systems:  Otherwise negative.    Physical Exam  Gen: Alert, oriented. Appears stated age.  HEENT:PERRLA. Lungs: No respiratory distress CV: RRR Abd: soft, benign, no masses Ext: No edema    Planned procedures: Proceed with EGD/colonoscopy. The patient understands the nature of the planned procedure, indications, risks, alternatives and potential complications including but not limited to bleeding, infection, perforation, damage to internal organs and possible oversedation/side effects from anesthesia. The patient agrees and gives consent to proceed.  Please refer to procedure notes for findings, recommendations and patient disposition/instructions.     Lesly Rubenstein, MD [facility]

## 2022-08-28 NOTE — Op Note (Signed)
[facility] Gastroenterology Patient Name: Jennifer Mclaughlin Procedure Date: 08/28/2022 8:16 AM MRN: [id number] Account #: [id number] Date of Birth: [birth year] Admit Type: Outpatient Age: 51 Room: [facility] ENDO ROOM 3 Gender: Female Note Status: Finalized Instrument Name: [name] [id number] Procedure:             Colonoscopy Indications:           Screening for colorectal malignant neoplasm Providers:             Andrey Farmer MD, MD Medicines:             Monitored Anesthesia Care Complications:         No immediate complications. Procedure:             Pre-Anesthesia Assessment:                        - Prior to the procedure, a History and Physical was                         performed, and patient medications and allergies were                         reviewed. The patient is competent. The risks and                         benefits of the procedure and the sedation options and                         risks were discussed with the patient. All questions                         were answered and informed consent was obtained.                         Patient identification and proposed procedure were                         verified by the physician, the nurse, the                         anesthesiologist, the anesthetist and the technician                         in the endoscopy suite. Mental Status Examination:                         alert and oriented. Airway Examination: normal                         oropharyngeal airway and neck mobility. Respiratory                         Examination: clear to auscultation. CV Examination:                         normal. Prophylactic Antibiotics: The patient does not                         require prophylactic antibiotics. Prior  Anticoagulants: The patient has taken no anticoagulant                         or antiplatelet agents. ASA Grade Assessment: II - A                         patient with  mild systemic disease. After reviewing                         the risks and benefits, the patient was deemed in                         satisfactory condition to undergo the procedure. The                         anesthesia plan was to use monitored anesthesia care                         (MAC). Immediately prior to administration of                         medications, the patient was re-assessed for adequacy                         to receive sedatives. The heart rate, respiratory                         rate, oxygen saturations, blood pressure, adequacy of                         pulmonary ventilation, and response to care were                         monitored throughout the procedure. The physical                         status of the patient was re-assessed after the                         procedure.                        After obtaining informed consent, the colonoscope was                         passed under direct vision. Throughout the procedure,                         the patient's blood pressure, pulse, and oxygen                         saturations were monitored continuously. The                         Colonoscope was introduced through the anus and                         advanced to the the terminal ileum. The colonoscopy  was performed without difficulty. The patient                         tolerated the procedure well. The quality of the bowel                         preparation was good. The terminal ileum, ileocecal                         valve, appendiceal orifice, and rectum were                         photographed. Findings:      The perianal and digital rectal examinations were normal.      The terminal ileum appeared normal.      Internal hemorrhoids were found during retroflexion. The hemorrhoids       were Grade II (internal hemorrhoids that prolapse but reduce       spontaneously). Impression:            - The examined portion of  the ileum was normal.                        - Internal hemorrhoids.                        - No specimens collected. Recommendation:        - Repeat colonoscopy in 10 years for screening                         purposes.                        - Return to referring physician as previously                         scheduled.                        - Discharge patient to home.                        - Resume previous diet.                        - Continue present medications. Procedure Code(s):     --- Professional ---                        S2876, Colorectal cancer screening; colonoscopy on                         individual not meeting criteria for high risk Diagnosis Code(s):     --- Professional ---                        Z12.11, Encounter for screening for malignant neoplasm                         of colon                        K64.1, Second degree hemorrhoids CPT copyright  2022 American Medical Association. All rights reserved. The codes documented in this report are preliminary and upon coder review may  be revised to meet current compliance requirements. Andrey Farmer MD, MD 08/28/2022 9:13:17 AM Number of Addenda: 0 Note Initiated On: 08/28/2022 8:16 AM Scope Withdrawal Time: 0 hours 8 minutes 29 seconds  Total Procedure Duration: 0 hours 11 minutes 22 seconds  Estimated Blood Loss:  Estimated blood loss: none.      [facility]

## 2022-08-28 NOTE — Op Note (Signed)
[facility] Gastroenterology Patient Name: Jennifer Mclaughlin Procedure Date: 08/28/2022 8:16 AM MRN: [id number] Account #: [id number] Date of Birth: 03-17-1971 Admit Type: Outpatient Age: 51 Room: [facility] ENDO ROOM 3 Gender: Female Note Status: Finalized Instrument Name: Upper Endoscope [id number] Procedure:             Upper GI endoscopy Indications:           Gastro-esophageal reflux disease Providers:             Andrey Farmer MD, MD Medicines:             Monitored Anesthesia Care Complications:         No immediate complications. Estimated blood loss:                         Minimal. Procedure:             Pre-Anesthesia Assessment:                        - Prior to the procedure, a History and Physical was                         performed, and patient medications and allergies were                         reviewed. The patient is competent. The risks and                         benefits of the procedure and the sedation options and                         risks were discussed with the patient. All questions                         were answered and informed consent was obtained.                         Patient identification and proposed procedure were                         verified by the physician, the nurse, the                         anesthesiologist, the anesthetist and the technician                         in the endoscopy suite. Mental Status Examination:                         alert and oriented. Airway Examination: normal                         oropharyngeal airway and neck mobility. Respiratory                         Examination: clear to auscultation. CV Examination:                         normal. Prophylactic Antibiotics: The patient does not  require prophylactic antibiotics. Prior                         Anticoagulants: The patient has taken no anticoagulant                         or antiplatelet agents. ASA Grade  Assessment: II - A                         patient with mild systemic disease. After reviewing                         the risks and benefits, the patient was deemed in                         satisfactory condition to undergo the procedure. The                         anesthesia plan was to use monitored anesthesia care                         (MAC). Immediately prior to administration of                         medications, the patient was re-assessed for adequacy                         to receive sedatives. The heart rate, respiratory                         rate, oxygen saturations, blood pressure, adequacy of                         pulmonary ventilation, and response to care were                         monitored throughout the procedure. The physical                         status of the patient was re-assessed after the                         procedure.                        After obtaining informed consent, the endoscope was                         passed under direct vision. Throughout the procedure,                         the patient's blood pressure, pulse, and oxygen                         saturations were monitored continuously. The Endoscope                         was introduced through the mouth, and advanced to the  second part of duodenum. The upper GI endoscopy was                         accomplished without difficulty. The patient tolerated                         the procedure well. Findings:      The examined esophagus was normal.      A single small sessile fundic gland polyp with no stigmata of recent       bleeding was found in the gastric fundus.      The exam of the stomach was otherwise normal.      Biopsies were taken with a cold forceps in the stomach for Helicobacter       pylori testing. Estimated blood loss was minimal.      The examined duodenum was normal. Impression:            - Normal esophagus.                        -  A single fundic gland polyp.                        - Normal examined duodenum.                        - Biopsies were taken with a cold forceps for                         Helicobacter pylori testing. Recommendation:        - Await pathology results.                        - Perform a colonoscopy today. Procedure Code(s):     --- Professional ---                        [phone], Esophagogastroduodenoscopy, flexible,                         transoral; with biopsy, single or multiple Diagnosis Code(s):     --- Professional ---                        K31.7, Polyp of stomach and duodenum                        K21.9, Gastro-esophageal reflux disease without                         esophagitis CPT copyright 2022 American Medical Association. All rights reserved. The codes documented in this report are preliminary and upon coder review may  be revised to meet current compliance requirements. Andrey Farmer MD, MD 08/28/2022 9:05:45 AM Number of Addenda: 0 Note Initiated On: 08/28/2022 8:16 AM Estimated Blood Loss:  Estimated blood loss was minimal.      [facility]

## 2022-08-28 NOTE — Transfer of Care (Signed)
Immediate Anesthesia Transfer of Care Note  Patient: Jennifer Mclaughlin  Procedure(s) Performed: COLONOSCOPY WITH PROPOFOL ESOPHAGOGASTRODUODENOSCOPY (EGD) WITH PROPOFOL  Patient Location: Endoscopy Unit  Anesthesia Type:General  Level of Consciousness: drowsy  Airway & Oxygen Therapy: Patient Spontanous Breathing  Post-op Assessment: Report given to RN and Post -op Vital signs reviewed and stable  Post vital signs: Reviewed and stable  Last Vitals:  Vitals Value Taken Time  BP    Temp    Pulse 66 08/28/22 0857  Resp 19 08/28/22 0857  SpO2 96 % 08/28/22 0857  Vitals shown include unvalidated device data.  Last Pain:  Vitals:   08/28/22 0759  TempSrc: Temporal  PainSc: 0-No pain         Complications: No notable events documented.

## 2022-08-28 NOTE — Anesthesia Preprocedure Evaluation (Signed)
Anesthesia Evaluation  Patient identified by MRN, date of birth, ID band Patient awake    Reviewed: Allergy & Precautions, NPO status , Patient's Chart, lab work & pertinent test results  Airway Mallampati: III  TM Distance: >3 FB Neck ROM: Full    Dental  (+) Teeth Intact   Pulmonary neg pulmonary ROS, Patient abstained from smoking., former smoker   Pulmonary exam normal breath sounds clear to auscultation       Cardiovascular Exercise Tolerance: Good hypertension, negative cardio ROS Normal cardiovascular exam Rhythm:Regular     Neuro/Psych negative neurological ROS  negative psych ROS   GI/Hepatic negative GI ROS, Neg liver ROS,GERD  Medicated,,  Endo/Other  negative endocrine ROSHypothyroidism    Renal/GU negative Renal ROS  negative genitourinary   Musculoskeletal   Abdominal Normal abdominal exam  (+)   Peds negative pediatric ROS (+)  Hematology negative hematology ROS (+) Blood dyscrasia, anemia   Anesthesia Other Findings Past Medical History: No date: Anemia No date: Hashimoto's disease No date: Hypertension No date: Hypothyroidism  Past Surgical History: No date: CHOLECYSTECTOMY No date: NASAL SINUS SURGERY No date: TONSILLECTOMY No date: TUBAL LIGATION     Reproductive/Obstetrics negative OB ROS                             Anesthesia Physical Anesthesia Plan  ASA: 2  Anesthesia Plan: General   Post-op Pain Management:    Induction: Intravenous  PONV Risk Score and Plan: Propofol infusion and TIVA  Airway Management Planned: Natural Airway  Additional Equipment:   Intra-op Plan:   Post-operative Plan:   Informed Consent: I have reviewed the patients History and Physical, chart, labs and discussed the procedure including the risks, benefits and alternatives for the proposed anesthesia with the patient or authorized representative who has indicated  his/her understanding and acceptance.     Dental Advisory Given  Plan Discussed with: CRNA and Surgeon  Anesthesia Plan Comments:        Anesthesia Quick Evaluation

## 2022-08-29 ENCOUNTER — Encounter: Payer: Self-pay | Admitting: Gastroenterology

## 2022-09-01 ENCOUNTER — Ambulatory Visit: Payer: Self-pay | Admitting: Nurse Practitioner

## 2022-09-01 LAB — SURGICAL PATHOLOGY

## 2022-09-08 ENCOUNTER — Other Ambulatory Visit: Payer: Self-pay

## 2022-09-09 ENCOUNTER — Other Ambulatory Visit: Payer: Self-pay

## 2022-09-10 ENCOUNTER — Telehealth: Payer: Self-pay | Admitting: *Deleted

## 2022-09-10 NOTE — Telephone Encounter (Signed)
Patient called and shared that she had received two insurance cards from [employer]. One of the card is a bronze card and the other is the Silver card. The silver card id the one that she is use to having and her cost to see the endocrinologist is $ 40 and the blood work is also cheaper. The bronze card to see doctor and have lab work it would cost the patient $225. She shares that this is a bit to much for her.  She has reached out to [employer] who advised her to reach out to [place]. She has been told that they would correct it to the silver card but that it would be February 1 before this is done. He next appointment is January 24 th at 3 PM.  Patient would like to reschedule her appointment as soon after 02/021/2024 as Possible. She will let us know if it is done any sooner.  Patient states that she is feeling better since being on thyroid medication for 1 month. She has had a couple of episodes of itching at night. She also shares that she feels that a lot of fluid is leaving her body.

## 2022-09-11 NOTE — Telephone Encounter (Signed)
Pt said she will wait and call back. She may keep this appt where it is

## 2022-09-11 NOTE — Telephone Encounter (Signed)
Yes we can certainly push out her appt. Ill attach brittany to this message.

## 2022-09-11 NOTE — Telephone Encounter (Signed)
Noted  

## 2022-09-15 LAB — TSH: TSH: 4.95 u[IU]/mL — ABNORMAL HIGH (ref 0.450–4.500)

## 2022-09-15 LAB — T4, FREE: Free T4: 1.11 ng/dL (ref 0.82–1.77)

## 2022-09-17 ENCOUNTER — Other Ambulatory Visit: Payer: Self-pay

## 2022-09-23 ENCOUNTER — Encounter: Payer: Self-pay | Admitting: Nurse Practitioner

## 2022-09-23 ENCOUNTER — Other Ambulatory Visit: Payer: Self-pay

## 2022-09-23 ENCOUNTER — Ambulatory Visit: Payer: 59 | Admitting: Nurse Practitioner

## 2022-09-23 VITALS — BP 136/87 | HR 73 | Ht 63.0 in | Wt 180.6 lb

## 2022-09-23 DIAGNOSIS — E063 Autoimmune thyroiditis: Secondary | ICD-10-CM | POA: Diagnosis not present

## 2022-09-23 DIAGNOSIS — E038 Other specified hypothyroidism: Secondary | ICD-10-CM | POA: Diagnosis not present

## 2022-09-23 MED ORDER — SYNTHROID 50 MCG PO TABS
50.0000 ug | ORAL_TABLET | Freq: Every day | ORAL | 1 refills | Status: DC
  Filled 2022-09-23: qty 90, 90d supply, fill #0
  Filled 2022-12-29: qty 90, 90d supply, fill #1

## 2022-09-23 NOTE — Progress Notes (Signed)
Endocrinology Follow Up Note                                         09/23/2022, 4:08 PM  Subjective:   Subjective    Jennifer Mclaughlin is a 52 y.o.-year-old female patient being seen in follow up after being seen in consultation for hypothyroidism referred by Idelle Crouch, MD.   Past Medical History:  Diagnosis Date   Anemia    Hashimoto's disease    Hypertension    Hypothyroidism     Past Surgical History:  Procedure Laterality Date   CHOLECYSTECTOMY     COLONOSCOPY WITH PROPOFOL N/A 08/28/2022   Procedure: COLONOSCOPY WITH PROPOFOL;  Surgeon: Lesly Rubenstein, MD;  Location: [facility] ENDOSCOPY;  Service: Endoscopy;  Laterality: N/A;   ESOPHAGOGASTRODUODENOSCOPY (EGD) WITH PROPOFOL N/A 08/28/2022   Procedure: ESOPHAGOGASTRODUODENOSCOPY (EGD) WITH PROPOFOL;  Surgeon: Lesly Rubenstein, MD;  Location: [facility] ENDOSCOPY;  Service: Endoscopy;  Laterality: N/A;   NASAL SINUS SURGERY     TONSILLECTOMY     TUBAL LIGATION      Social History   Socioeconomic History   Marital status: Single    Spouse name: Not on file   Number of children: Not on file   Years of education: Not on file   Highest education level: Not on file  Occupational History   Not on file  Tobacco Use   Smoking status: Former   Smokeless tobacco: Never  Vaping Use   Vaping Use: Never used  Substance and Sexual Activity   Alcohol use: Yes    Alcohol/week: 0.0 standard drinks of alcohol    Comment: occas   Drug use: No   Sexual activity: Not on file  Other Topics Concern   Not on file  Social History Narrative   Not on file   Social Determinants of Health   Financial Resource Strain: Not on file  Food Insecurity: Not on file  Transportation Needs: Not on file  Physical Activity: Not on file  Stress: Not on file  Social Connections: Not on file    Family History  Problem Relation Age of Onset   Thyroid cancer Father     Breast cancer Neg Hx     Outpatient Encounter Medications as of 09/23/2022  Medication Sig   ALPRAZolam (XANAX) 0.5 MG tablet TAKE 1 TABLET BY MOUTH NIGHTLY AS NEEDED FOR SLEEP   cetirizine (ZYRTEC) 10 MG tablet Take 10 mg by mouth daily.   cyanocobalamin (,VITAMIN B-12,) 1000 MCG/ML injection Inject 1 mL (1,000 mcg total) into the muscle every 14 (fourteen) days   cyanocobalamin (VITAMIN B12) 1000 MCG/ML injection Inject 1 mL (1,000 mcg total) into the muscle every 14 (fourteen) days   omeprazole (PRILOSEC) 20 MG capsule TAKE 1 CAPSULE (20 MG TOTAL) BY MOUTH ONCE DAILY   omeprazole (PRILOSEC) 20 MG capsule Take 1 capsule (20 mg total) by mouth daily.   SYNTHROID 50 MCG tablet Take 1 tablet (50 mcg total) by mouth daily before breakfast.   [  DISCONTINUED] SYNTHROID 25 MCG tablet Take 1 tablet (25 mcg total) by mouth daily before breakfast.   [DISCONTINUED] SYNTHROID 25 MCG tablet Take 1 tablet (25 mcg total) by mouth daily before breakfast.   [DISCONTINUED] hydrALAZINE (APRESOLINE) 10 MG tablet Take 1 tablet (10 mg total) by mouth 3 (three) times daily as needed (For blood pressure greater than 150/90)   No facility-administered encounter medications on file as of 09/23/2022.    ALLERGIES: Allergies  Allergen Reactions   Biaxin [Clarithromycin] Swelling   Ceftin [Cefuroxime Axetil] Swelling   Penicillins Hives   Septra [Sulfamethoxazole-Trimethoprim] Swelling   Sulfa Antibiotics Swelling    Other reaction(s): Unknown   Amoxicillin     Other reaction(s): Unknown   Dicyclomine     Other reaction(s): Other (See Comments) Flushing   Hydrochlorothiazide     Other reaction(s): Other (See Comments) Swollen tongue   Linaclotide Diarrhea    Flushing   Nebivolol     Other reaction(s): Unknown   Telmisartan     Other reaction(s): Unknown   Levothyroxine Sodium Rash   VACCINATION STATUS:  There is no immunization history on file for this patient.   HPI   Jennifer Mclaughlin is a  patient with the above medical history. she was diagnosed with problems with her thyroid dating as far back as 2016.  She notes during pregnancy, she had a ?nodule removed as it flared up (no history of such surgery was available to review).  She notes her thyroid has fluctuated between hypoactive and normal for years but reports most recently her symptoms have worsened and become more consistent.  She was on Levothyroxine 50 mcg po daily by her PCP but reports she felt terrible after taking it, says it caused her heart to race.  She notes she is sensitive to medications in general.     I reviewed patient's thyroid tests:  Lab Results  Component Value Date   TSH 4.950 (H) 09/14/2022   TSH 15.900 (H) 07/21/2022   FREET4 1.11 09/14/2022   FREET4 0.65 (L) 07/21/2022     SEE care everywhere for recent thyroid labs.  Pt describes: - weight gain despite eating healthier - fatigue - cold intolerance - mood swings - dry skin - hair loss - generalized edema  Pt denies feeling nodules in neck, hoarseness, dysphagia/odynophagia, SOB with lying down.  she does have strong family history of thyroid disorders in her cousins, paternal aunts.  Her father did have thyroid cancer requiring surgical removal.  No history of radiation therapy to head or neck.  No recent use of iodine supplements.  Denies use of Biotin containing supplements.  I reviewed her chart and she also has a history of pernicious anemia, GERD, HTN, IBS, HLD.   ROS:  Constitutional: + weight gain, + fatigue, fluctuating hot/cold spells Eyes: no blurry vision, no xerophthalmia ENT: no sore throat, no nodules palpated in throat, + intermittent dysphagia/odynophagia, no hoarseness Cardiovascular: no chest pain, no SOB, no palpitations, + generalized swelling (mostly in right hand and left foot)- recently diagnosed with RA Respiratory: no cough, no SOB Gastrointestinal: no nausea/vomiting/diarrhea Musculoskeletal: no muscle/joint  aches Skin: no rashes, + hair loss Neurological: no tremors, no numbness, no tingling, no dizziness Psychiatric: + mood swings, reports insomnia has improved   Objective:   Objective     BP 136/87 (BP Location: Left Arm, Patient Position: Sitting, Cuff Size: Normal)   Pulse 73   Ht '5\' 3"'$  (1.6 m)   Wt 180 lb 9.6 oz (  81.9 kg)   BMI 31.99 kg/m  Wt Readings from Last 3 Encounters:  09/23/22 180 lb 9.6 oz (81.9 kg)  08/28/22 178 lb (80.7 kg)  07/29/22 180 lb 3.2 oz (81.7 kg)    BP Readings from Last 3 Encounters:  09/23/22 136/87  08/28/22 106/82  07/29/22 (!) 156/101      Physical Exam- Limited  Constitutional:  Body mass index is 31.99 kg/m. , not in acute distress, normal state of mind Eyes:  EOMI, no exophthalmos Neck: Supple Musculoskeletal: no gross deformities, strength intact in all four extremities, no gross restriction of joint movements Skin:  no rashes, no hyperemia Neurological: no tremor with outstretched hands   CMP ( most recent) CMP     Component Value Date/Time   NA 139 06/22/2022 1505   NA 140 03/08/2013 1335   K 3.6 06/22/2022 1505   K 3.8 03/08/2013 1335   CL 108 06/22/2022 1505   CL 107 03/08/2013 1335   CO2 22 06/22/2022 1505   CO2 25 03/08/2013 1335   GLUCOSE 97 06/22/2022 1505   GLUCOSE 97 03/08/2013 1335   BUN 13 06/22/2022 1505   BUN 8 03/08/2013 1335   CREATININE 0.64 06/22/2022 1505   CREATININE 0.68 03/08/2013 1335   CALCIUM 9.8 06/22/2022 1505   CALCIUM 9.0 03/08/2013 1335   PROT 7.6 06/22/2022 1530   ALBUMIN 4.5 06/22/2022 1530   AST 38 06/22/2022 1530   ALT 16 06/22/2022 1530   ALKPHOS 59 06/22/2022 1530   BILITOT 0.9 06/22/2022 1530   GFRNONAA >60 06/22/2022 1505   GFRNONAA >60 03/08/2013 1335   GFRAA >60 09/06/2015 1104   GFRAA >60 03/08/2013 1335     Diabetic Labs (most recent): No results found for: "HGBA1C", "MICROALBUR"   Lipid Panel ( most recent) Lipid Panel  No results found for: "CHOL", "TRIG", "HDL",  "CHOLHDL", "VLDL", "LDLCALC", "LDLDIRECT", "LABVLDL"     Lab Results  Component Value Date   TSH 4.950 (H) 09/14/2022   TSH 15.900 (H) 07/21/2022   FREET4 1.11 09/14/2022   FREET4 0.65 (L) 07/21/2022    ------------------------------------------------------------------------------------------------------------ Thyroid US from 03/30/22 Indication: Anterior neck pain  Comparison: Neck Ultrasound on 01/29/2015  Technique: Gray-scale and color Doppler images of the neck were obtained.  Findings: THYROID: The right lobe of the thyroid measures 4.9 x 1.8 x 1.3 cm. The left lobe of the thyroid measures 3.5 x 1.6 x 1.2 cm. The isthmus measures 0.7 cm in AP depth.  Echotexture of the thyroid is homogeneous.  There is a solitary left-sided 0.6 x 0.4 x 0.6 cm hypoechoic nodule in the lower pole.  LYMPH NODES: Right neck level II 1.6 x 0.9 x 1.2 cm Left neck level III 0.9 x 0.5 x 1.0 cm Left neck level III 0.9 x 0.5 x 1.1 cm Left neck level III 1.4 x 0.8 x 1.1 cm Left neck level IV 1.0 x 0.5 x 0.5 cm  Impression: Homogeneous thyroid gland which is not enlarged.  There is a left-sided 0.6 cm thyroid nodule which is hypoechoic. It is too small to warrant biopsy. Thyroid nodule not reported previously on ultrasound dated 01/29/2015. Consider follow up ultrasound in one year to reassess for growth or change.  Bilateral lymphadenopathy present. Lymph nodes are unremarkable appearing and stable from last ultrasound in 2016. Exam End: 03/30/22 14:59 Last Resulted: 04/01/22 13:16  Received From: [facility]       Thyroid US from 07/27/22 CLINICAL DATA:  52 year old female with a history  family thyroid cancer   EXAM: THYROID ULTRASOUND   TECHNIQUE: Ultrasound examination of the thyroid gland and adjacent soft tissues was performed.   COMPARISON:  12/13/2007   FINDINGS: Parenchymal Echotexture: Mildly heterogenous   Isthmus: 0.5 cm   Right lobe: 4.1 cm  x 1.1 cm x 1.4 cm   Left lobe: 3.4 cm x 1.1 cm x 1.4 cm   _________________________________________________________   Estimated total number of nodules >/= 1 cm: 0   Number of spongiform nodules >/=  2 cm not described below (TR1): 0   Number of mixed cystic and solid nodules >/= 1.5 cm not described below ([place]): 0   _________________________________________________________   Nodule # 1:   Location: Left; Inferior   Maximum size: 0.7 cm; Other 2 dimensions:  0.5 cm x 0.6 cm   Composition: cannot determine (2)   Echogenicity: hypoechoic (2)   Shape: taller-than-wide (3)   Margins: ill-defined (0)   Echogenic foci: none (0)   ACR TI-RADS total points: 6.   ACR TI-RADS risk category: TR4 (4-6 points).   ACR TI-RADS recommendations:   Nodule does not meet criteria for surveillance   _________________________________________________________   There are borderline enlarged lymph nodes in the left neck, with questionable loss of the normal ratio of height-with.   IMPRESSION: No thyroid nodule meets criteria for biopsy or surveillance, as designated by the newly established ACR TI-RADS criteria.   Recommendations follow those established by the new ACR TI-RADS criteria (J Am Coll Radiol 7017;79:390-300).   There are questionable borderline enlarged lymph nodes of the left neck. These are nonspecific and may be reactive. Correlation with any history of inflammation/infection or any known malignancy may be useful, and if these persist, further evaluation with contrast-enhanced neck CT may be useful.     Electronically Signed   By: Corrie Mckusick D.O.   On: 07/27/2022 17:04    Latest Reference Range & Units 07/21/22 15:16 09/14/22 14:05  TSH 0.450 - 4.500 uIU/mL 15.900 (H) 4.950 (H)  Triiodothyronine,Free,Serum 2.0 - 4.4 pg/mL 2.3   T4,Free(Direct) 0.82 - 1.77 ng/dL 0.65 (L) 1.11  Thyroperoxidase Ab SerPl-aCnc 0 - 34 IU/mL 360 (H)   Thyroglobulin Antibody 0.0 -  0.9 IU/mL 153.8 (H)   (H): Data is abnormally high (L): Data is abnormally low  Assessment & Plan:   ASSESSMENT / PLAN:  1. Hypothyroidism-r/t Hashimotos thyroiditis  - Her repeat thyroid function tests show positive antibodies, confirming autoimmune etiology.    Her previsit thyroid function tests are consistent with slight under-replacement.  She is advised to increase her branded Synthroid to 50 mcg po daily before breakfast (she may take 2 of her 25 mcg tabs until she depletes her current supply).     - The correct intake of thyroid hormone (Levothyroxine, Synthroid), is on empty stomach first thing in the morning, with water, separated by at least 30 minutes from breakfast and other medications,  and separated by more than 4 hours from calcium, iron, multivitamins, acid reflux medications (PPIs).  - This medication is a life-long medication and will be needed to correct thyroid hormone imbalances for the rest of your life.  The dose may change from time to time, based on thyroid blood work.  - It is extremely important to be consistent taking this medication, near the same time each morning.  -AVOID TAKING PRODUCTS CONTAINING BIOTIN (commonly found in Hair, Skin, Nails vitamins) AS IT INTERFERES WITH THE VALIDITY OF THYROID FUNCTION BLOOD TESTS.  I spent 25 minutes in the care of the patient today including review of labs from Thyroid Function, CMP, and other relevant labs ; imaging/biopsy records (current and previous including abstractions from other facilities); face-to-face time discussing  her lab results and symptoms, medications doses, her options of short and long term treatment based on the latest standards of care / guidelines;   and documenting the encounter.  Burman Blacksmith Evilsizer  participated in the discussions, expressed understanding, and voiced agreement with the above plans.  All questions were answered to her satisfaction. she is encouraged to contact clinic  should she have any questions or concerns prior to her return visit.   FOLLOW UP PLAN:  Return in about 2 months (around 11/22/2022) for Thyroid follow up, Previsit labs.  Rayetta Pigg, [facility] [address] [place],  [address] Phone: [phone] Fax: [phone]  09/23/2022, 4:08 PM

## 2022-10-01 ENCOUNTER — Other Ambulatory Visit: Payer: Self-pay

## 2022-10-05 ENCOUNTER — Telehealth: Payer: Self-pay | Admitting: *Deleted

## 2022-10-05 NOTE — Telephone Encounter (Signed)
Patient states that she was seen in the office on January 24 , the next day she started the Synthroid 50 mcg. She reports that for the last 3 days, Saturday , Sunday and today , she has been an Engineer, petroleum. She is asking of the change in the Synthroid dosage could be causing this?

## 2022-10-05 NOTE — Telephone Encounter (Signed)
Perhaps.  Her body may be reacting to the change but sometimes the symptoms will go away as her body adjusts to the dosage change.  If the symptoms do not resolve with time, then maybe the dosage is too high (which can be tricky because there is not a pill dose between 25 mcg and 50 mcg so we may have to get more creative) and we may need to repeat labs sooner to see what might be going on.

## 2022-10-05 NOTE — Telephone Encounter (Signed)
Patient was called and made aware. 

## 2022-10-07 ENCOUNTER — Other Ambulatory Visit: Payer: Self-pay

## 2022-10-07 MED ORDER — METHOTREXATE SODIUM 2.5 MG PO TABS
12.5000 mg | ORAL_TABLET | ORAL | 1 refills | Status: DC
  Filled 2022-10-07: qty 20, 28d supply, fill #0
  Filled 2022-11-17: qty 20, 28d supply, fill #1

## 2022-10-07 MED ORDER — FOLIC ACID 1 MG PO TABS
1.0000 mg | ORAL_TABLET | Freq: Every day | ORAL | 3 refills | Status: DC
  Filled 2022-10-07: qty 30, 30d supply, fill #0
  Filled 2022-11-01: qty 30, 30d supply, fill #1
  Filled 2022-11-30: qty 30, 30d supply, fill #2
  Filled 2022-12-12 – 2023-01-06 (×2): qty 30, 30d supply, fill #3
  Filled 2023-01-28 – 2023-02-03 (×2): qty 30, 30d supply, fill #4
  Filled 2023-02-26: qty 30, 30d supply, fill #5
  Filled 2023-04-07: qty 30, 30d supply, fill #6
  Filled 2023-04-24: qty 30, 30d supply, fill #7
  Filled 2023-06-06: qty 30, 30d supply, fill #8
  Filled 2023-06-23 – 2023-07-08 (×2): qty 30, 30d supply, fill #9
  Filled 2023-08-05: qty 30, 30d supply, fill #10
  Filled 2023-08-22 – 2023-09-06 (×2): qty 30, 30d supply, fill #11

## 2022-10-07 MED ORDER — PREDNISONE 5 MG PO TABS
ORAL_TABLET | ORAL | 0 refills | Status: DC
  Filled 2022-10-07: qty 70, 28d supply, fill #0

## 2022-10-09 ENCOUNTER — Other Ambulatory Visit: Payer: Self-pay

## 2022-10-15 ENCOUNTER — Other Ambulatory Visit: Payer: Self-pay

## 2022-10-16 ENCOUNTER — Telehealth: Payer: Self-pay | Admitting: *Deleted

## 2022-10-16 NOTE — Telephone Encounter (Signed)
Committing to a whole food plant based diet can help with symptoms of RA, so much so, that many patients are able to come off medications for RA.  Rheumatoid arthritis is an autoimmune disease which means that no matter what, her immune system is going to attack her joints causing inflammation.  But, that inflammation can be improved if we feed the body high quality foods.  I suggest she avoid all animal products (meat, dairy, cheese) to start and eat plenty of fruits, vegetables, nuts, beans, seeds.

## 2022-10-16 NOTE — Telephone Encounter (Signed)
Patient was called and given Whitney's recommendation. Patient shares that she has already started leaving out the animal products.

## 2022-10-16 NOTE — Telephone Encounter (Signed)
Patient had a spot in her hand. She saw a doctor and has been diagnosed with Rheumatoid Arthritis. She has been place on the following medications. Prednisone dose pack , Folic acid and Methotrexate. She has taken the Prednisone, Folic Acid, She has not started the Methotrexate. She is concerned about taking it with the Thyroid medication and especially since the thyroid medication was increased.  She is asking for Whitney's input.   She also shares that she has gone from taking no medications to all of this. She is thinking of not taking the Methotrexate and just changing her foods in her diet. Also , going back to her natural oils.

## 2022-10-18 ENCOUNTER — Other Ambulatory Visit: Payer: Self-pay

## 2022-10-20 ENCOUNTER — Other Ambulatory Visit: Payer: Self-pay

## 2022-10-21 ENCOUNTER — Other Ambulatory Visit: Payer: Self-pay

## 2022-10-22 ENCOUNTER — Other Ambulatory Visit: Payer: Self-pay

## 2022-10-22 ENCOUNTER — Telehealth: Payer: Self-pay

## 2022-10-22 MED ORDER — SYNTHROID 25 MCG PO TABS
25.0000 ug | ORAL_TABLET | ORAL | 0 refills | Status: DC
  Filled 2022-10-22: qty 15, 30d supply, fill #0
  Filled 2022-10-25 – 2022-11-06 (×2): qty 15, 30d supply, fill #1
  Filled 2022-12-12: qty 15, 30d supply, fill #2

## 2022-10-22 NOTE — Telephone Encounter (Signed)
We can always try alternating doses between the 25 mcg and 50 mcg on every other day to see how she tolerates that.

## 2022-10-22 NOTE — Telephone Encounter (Signed)
Pt was here on 09/23/22. At that time her Levothyroxine was increased from 28mg to 547m. She states that since the increase she has been emotional/moody and it is not any better after almost a month. She wants to know what she can do/ She is requesting to decrease it back to 2532m I did explain to her why it was increased due to her labs on 1/15.

## 2022-10-22 NOTE — Telephone Encounter (Signed)
Pt notified and agrees. 

## 2022-10-25 ENCOUNTER — Other Ambulatory Visit: Payer: Self-pay

## 2022-10-30 ENCOUNTER — Other Ambulatory Visit: Payer: Self-pay

## 2022-11-02 ENCOUNTER — Other Ambulatory Visit: Payer: Self-pay

## 2022-11-06 ENCOUNTER — Other Ambulatory Visit: Payer: Self-pay

## 2022-11-13 LAB — T4, FREE: Free T4: 1.18 ng/dL (ref 0.82–1.77)

## 2022-11-13 LAB — TSH: TSH: 1.34 u[IU]/mL (ref 0.450–4.500)

## 2022-11-17 ENCOUNTER — Other Ambulatory Visit: Payer: Self-pay

## 2022-11-18 ENCOUNTER — Telehealth: Payer: Self-pay | Admitting: *Deleted

## 2022-11-18 NOTE — Telephone Encounter (Signed)
She will most likely experience issues with underactive thyroid: Fatigue, joint aches, constipation, cold intolerance, hair loss, weight gain, etc if she lowers back to the 25 mcg.   I know I tried sending in the branded Synthroid for her to see if it was better tolerated.  We do have one more option to switch to Tirosint (a purer form of synthetic hormone replacement) or Armour thyroid (which is pig desiccated thyroid and may be harder to dose properly based on labs).  What are her thoughts on trying a different formulation?

## 2022-11-18 NOTE — Telephone Encounter (Signed)
Patient called and made aware of Whitney's recommendation. She states that she is okay , she just was not sure if the flushing and the rash were normal. Patient has appointment on Monday and will discuss the other formulations at that time.

## 2022-11-18 NOTE — Telephone Encounter (Signed)
Asking if she may take the 25 mcg daily of her medication instead of alternating.  She is asking because yesterday and today her face is swelling and flushing.

## 2022-11-19 NOTE — Patient Instructions (Signed)

## 2022-11-23 ENCOUNTER — Ambulatory Visit: Payer: 59 | Admitting: Nurse Practitioner

## 2022-11-23 ENCOUNTER — Encounter: Payer: Self-pay | Admitting: Nurse Practitioner

## 2022-11-23 VITALS — BP 140/90 | HR 71 | Ht 63.0 in | Wt 181.2 lb

## 2022-11-23 DIAGNOSIS — E038 Other specified hypothyroidism: Secondary | ICD-10-CM

## 2022-11-23 DIAGNOSIS — E063 Autoimmune thyroiditis: Secondary | ICD-10-CM | POA: Diagnosis not present

## 2022-11-23 NOTE — Progress Notes (Signed)
Endocrinology Follow Up Note                                         11/23/2022, 3:04 PM  Subjective:   Subjective    Jennifer Mclaughlin is a 52 y.o.-year-old female patient being seen in follow up after being seen in consultation for hypothyroidism referred by Idelle Crouch, MD.   Past Medical History:  Diagnosis Date   Anemia    Hashimoto's disease    Hypertension    Hypothyroidism     Past Surgical History:  Procedure Laterality Date   CHOLECYSTECTOMY     COLONOSCOPY WITH PROPOFOL N/A 08/28/2022   Procedure: COLONOSCOPY WITH PROPOFOL;  Surgeon: Lesly Rubenstein, MD;  Location: [facility];  Service: Endoscopy;  Laterality: N/A;   ESOPHAGOGASTRODUODENOSCOPY (EGD) WITH PROPOFOL N/A 08/28/2022   Procedure: ESOPHAGOGASTRODUODENOSCOPY (EGD) WITH PROPOFOL;  Surgeon: Lesly Rubenstein, MD;  Location: [facility] END[facility]COPY;  Service: Endoscopy;  Laterality: N/A;   NASAL SINUS SURGERY     TONSILLECTOMY     TUBAL LIGATION      Social History   Socioeconomic History   Marital status: Single    Spouse name: Not on file   Number of children: Not on file   Years of education: Not on file   Highest education level: Not on file  Occupational History   Not on file  Tobacco Use   Smoking status: Former   Smokeless tobacco: Never  Vaping Use   Vaping Use: Never used  Substance and Sexual Activity   Alcohol use: Yes    Alcohol/week: 0.0 standard drinks of alcohol    Comment: occas   Drug use: No   Sexual activity: Not on file  Other Topics Concern   Not on file  Social History Narrative   Not on file   Social Determinants of Health   Financial Resource Strain: Not on file  Food Insecurity: Not on file  Transportation Needs: Not on file  Physical Activity: Not on file  Stress: Not on file  Social Connections: Not on file    Family History  Problem Relation Age of Onset   Thyroid cancer Father     Breast cancer Neg Hx     Outpatient Encounter Medications as of 11/23/2022  Medication Sig   ALPRAZolam (XANAX) 0.5 MG tablet TAKE 1 TABLET BY MOUTH NIGHTLY AS NEEDED FOR SLEEP   cetirizine (ZYRTEC) 10 MG tablet Take 10 mg by mouth daily.   cyanocobalamin (,VITAMIN B-12,) 1000 MCG/ML injection Inject 1 mL (1,000 mcg total) into the muscle every 14 (fourteen) days   cyanocobalamin (VITAMIN B12) 1000 MCG/ML injection Inject 1 mL (1,000 mcg total) into the muscle every 14 (fourteen) days   folic acid (FOLVITE) 1 MG tablet Take 1 tablet (1 mg total) by mouth daily.   methotrexate (RHEUMATREX) 2.5 MG tablet Take 5 tablets (12.5 mg total) by mouth every 7 (seven) days. All on the same day.   omeprazole (PRILOSEC) 20 MG capsule Take 1 capsule (  20 mg total) by mouth daily.   omeprazole (PRILOSEC) 20 MG capsule Take 1 capsule (20 mg total) by mouth daily.   OVER THE COUNTER MEDICATION Vitality pack form natural oils - patient takes 1 by mouth daily   SYNTHROID 25 MCG tablet Take 1 tablet (25 mcg total) by mouth every other day.   SYNTHROID 50 MCG tablet Take 1 tablet (50 mcg total) by mouth daily before breakfast.   Turmeric (QC TUMERIC COMPLEX PO) Take by mouth. Patient states that she takes daily   predniSONE (DELTASONE) 5 MG tablet Take 4 tablets by mouth once daily for 7 days, 3 tablets daily for 7 days, 2 tablets daily for 7 days, THEN 1 tablet once daily for 7 days. (Patient not taking: Reported on 11/23/2022)   [DISCONTINUED] hydrALAZINE (APRESOLINE) 10 MG tablet Take 1 tablet (10 mg total) by mouth 3 (three) times daily as needed (For blood pressure greater than 150/90)   No facility-administered encounter medications on file as of 11/23/2022.    ALLERGIES: Allergies  Allergen Reactions   Biaxin [Clarithromycin] Swelling   Ceftin [Cefuroxime Axetil] Swelling   Penicillins Hives   Septra [Sulfamethoxazole-Trimethoprim] Swelling   Sulfa Antibiotics Swelling    Other reaction(s): Unknown    Amoxicillin     Other reaction(s): Unknown   Dicyclomine     Other reaction(s): Other (See Comments) Flushing   Hydrochlorothiazide     Other reaction(s): Other (See Comments) Swollen tongue   Linaclotide Diarrhea    Flushing   Nebivolol     Other reaction(s): Unknown   Telmisartan     Other reaction(s): Unknown   Levothyroxine Sodium Rash   VACCINATION STATUS:  There is no immunization history on file for this patient.   HPI   Jennifer Mclaughlin is a patient with the above medical history. she was diagnosed with problems with her thyroid dating as far back as 2016.  She notes during pregnancy, she had a ?nodule removed as it flared up (no history of such surgery was available to review).  She notes her thyroid has fluctuated between hypoactive and normal for years but reports most recently her symptoms have worsened and become more consistent.  She was on Levothyroxine 50 mcg po daily by her PCP but reports she felt terrible after taking it, says it caused her heart to race.  She notes she is sensitive to medications in general.     I reviewed patient's thyroid tests:  Lab Results  Component Value Date   TSH 1.340 11/12/2022   TSH 4.950 (H) 09/14/2022   TSH 15.900 (H) 07/21/2022   FREET4 1.18 11/12/2022   FREET4 1.11 09/14/2022   FREET4 0.65 (L) 07/21/2022     SEE care everywhere for recent thyroid labs.  Pt describes: - weight gain despite eating healthier - fatigue - cold intolerance - mood swings - dry skin - hair loss - generalized edema  Pt denies feeling nodules in neck, hoarseness, dysphagia/odynophagia, SOB with lying down.  she does have strong family history of thyroid disorders in her cousins, paternal aunts.  Her father did have thyroid cancer requiring surgical removal.  No history of radiation therapy to head or neck.  No recent use of iodine supplements.  Denies use of Biotin containing supplements.  I reviewed her chart and she also has a history of  pernicious anemia, GERD, HTN, IBS, HLD.   Review of systems  Constitutional: + Minimally fluctuating body weight,  current Body mass index is 32.1 kg/m. , +  intermittent fatigue, no subjective hyperthermia, no subjective hypothermia Eyes: no blurry vision, no xerophthalmia ENT: no sore throat, no nodules palpated in throat, no dysphagia/odynophagia, no hoarseness Cardiovascular: no chest pain, no shortness of breath, no palpitations, no leg swelling Respiratory: no cough, no shortness of breath Gastrointestinal: no nausea/vomiting/diarrhea Musculoskeletal: + generalized muscle/joint aches (recently diagnosed with RA on Methotrexate) Skin: no rashes, no hyperemia Neurological: no tremors, no numbness, no tingling, no dizziness Psychiatric: no depression, no anxiety   Objective:   Objective     BP (!) 140/90 Comment: Retake - manuel cuff  Pulse 71   Ht 5\' 3"  (1.6 m)   Wt 181 lb 3.2 oz (82.2 kg)   BMI 32.10 kg/m  Wt Readings from Last 3 Encounters:  11/23/22 181 lb 3.2 oz (82.2 kg)  09/23/22 180 lb 9.6 oz (81.9 kg)  08/28/22 178 lb (80.7 kg)    BP Readings from Last 3 Encounters:  11/23/22 (!) 140/90  09/23/22 136/87  08/28/22 106/82      Physical Exam- Limited  Constitutional:  Body mass index is 32.1 kg/m. , not in acute distress, normal state of mind Eyes:  EOMI, no exophthalmos Neck: Supple Musculoskeletal: no gross deformities, strength intact in all four extremities, no gross restriction of joint movements Skin:  no rashes, no hyperemia Neurological: no tremor with outstretched hands   CMP ( most recent) CMP     Component Value Date/Time   NA 139 06/22/2022 1505   NA 140 03/08/2013 1335   K 3.6 06/22/2022 1505   K 3.8 03/08/2013 1335   CL 108 06/22/2022 1505   CL 107 03/08/2013 1335   CO2 22 06/22/2022 1505   CO2 25 03/08/2013 1335   GLUCOSE 97 06/22/2022 1505   GLUCOSE 97 03/08/2013 1335   BUN 13 06/22/2022 1505   BUN 8 03/08/2013 1335    CREATININE 0.64 06/22/2022 1505   CREATININE 0.68 03/08/2013 1335   CALCIUM 9.8 06/22/2022 1505   CALCIUM 9.0 03/08/2013 1335   PROT 7.6 06/22/2022 1530   ALBUMIN 4.5 06/22/2022 1530   AST 38 06/22/2022 1530   ALT 16 06/22/2022 1530   ALKPHOS 59 06/22/2022 1530   BILITOT 0.9 06/22/2022 1530   GFRNONAA >60 06/22/2022 1505   GFRNONAA >60 03/08/2013 1335   GFRAA >60 09/06/2015 1104   GFRAA >60 03/08/2013 1335     Diabetic Labs (most recent): No results found for: "HGBA1C", "MICROALBUR"   Lipid Panel ( most recent) Lipid Panel  No results found for: "CHOL", "TRIG", "HDL", "CHOLHDL", "VLDL", "LDLCALC", "LDLDIRECT", "LABVLDL"     Lab Results  Component Value Date   TSH 1.340 11/12/2022   TSH 4.950 (H) 09/14/2022   TSH 15.900 (H) 07/21/2022   FREET4 1.18 11/12/2022   FREET4 1.11 09/14/2022   FREET4 0.65 (L) 07/21/2022    ------------------------------------------------------------------------------------------------------------ Thyroid US from 03/30/22 Indication: Anterior neck pain  Comparison: Neck Ultrasound on 01/29/2015  Technique: Gray-scale and color Doppler images of the neck were obtained.  Findings: THYROID: The right lobe of the thyroid measures 4.9 x 1.8 x 1.3 cm. The left lobe of the thyroid measures 3.5 x 1.6 x 1.2 cm. The isthmus measures 0.7 cm in AP depth.  Echotexture of the thyroid is homogeneous.  There is a solitary left-sided 0.6 x 0.4 x 0.6 cm hypoechoic nodule in the lower pole.  LYMPH NODES: Right neck level II 1.6 x 0.9 x 1.2 cm Left neck level III 0.9 x 0.5 x 1.0 cm Left neck level III  0.9 x 0.5 x 1.1 cm Left neck level III 1.4 x 0.8 x 1.1 cm Left neck level IV 1.0 x 0.5 x 0.5 cm  Impression: Homogeneous thyroid gland which is not enlarged.  There is a left-sided 0.6 cm thyroid nodule which is hypoechoic. It is too small to warrant biopsy. Thyroid nodule not reported previously on ultrasound dated 01/29/2015. Consider follow up  ultrasound in one year to reassess for growth or change.  Bilateral lymphadenopathy present. Lymph nodes are unremarkable appearing and stable from last ultrasound in 2016. Exam End: 03/30/22 14:59 Last Resulted: 04/01/22 13:16  Received From: [facility]       Thyroid US from 07/27/22 CLINICAL DATA:  52 year old female with a history family thyroid cancer   EXAM: THYROID ULTRASOUND   TECHNIQUE: Ultrasound examination of the thyroid gland and adjacent soft tissues was performed.   COMPARISON:  12/13/2007   FINDINGS: Parenchymal Echotexture: Mildly heterogenous   Isthmus: 0.5 cm   Right lobe: 4.1 cm x 1.1 cm x 1.4 cm   Left lobe: 3.4 cm x 1.1 cm x 1.4 cm   _________________________________________________________   Estimated total number of nodules >/= 1 cm: 0   Number of spongiform nodules >/=  2 cm not described below (TR1): 0   Number of mixed cystic and solid nodules >/= 1.5 cm not described below ([place]): 0   _________________________________________________________   Nodule # 1:   Location: Left; Inferior   Maximum size: 0.7 cm; Other 2 dimensions:  0.5 cm x 0.6 cm   Composition: cannot determine (2)   Echogenicity: hypoechoic (2)   Shape: taller-than-wide (3)   Margins: ill-defined (0)   Echogenic foci: none (0)   ACR TI-RADS total points: 6.   ACR TI-RADS risk category: TR4 (4-6 points).   ACR TI-RADS recommendations:   Nodule does not meet criteria for surveillance   _________________________________________________________   There are borderline enlarged lymph nodes in the left neck, with questionable loss of the normal ratio of height-with.   IMPRESSION: No thyroid nodule meets criteria for biopsy or surveillance, as designated by the newly established ACR TI-RADS criteria.   Recommendations follow those established by the new ACR TI-RADS criteria (J Am Coll Radiol A9880051).   There are questionable  borderline enlarged lymph nodes of the left neck. These are nonspecific and may be reactive. Correlation with any history of inflammation/infection or any known malignancy may be useful, and if these persist, further evaluation with contrast-enhanced neck CT may be useful.     Electronically Signed   By: Corrie Mckusick D.O.   On: 07/27/2022 17:04    Latest Reference Range & Units 07/21/22 15:16 09/14/22 14:05 11/12/22 13:41  TSH 0.450 - 4.500 uIU/mL 15.900 (H) 4.950 (H) 1.340  Triiodothyronine,Free,Serum 2.0 - 4.4 pg/mL 2.3    T4,Free(Direct) 0.82 - 1.77 ng/dL 0.65 (L) 1.11 1.18  Thyroperoxidase Ab SerPl-aCnc 0 - 34 IU/mL 360 (H)    Thyroglobulin Antibody 0.0 - 0.9 IU/mL 153.8 (H)    (H): Data is abnormally high (L): Data is abnormally low  Assessment & Plan:   ASSESSMENT / PLAN:  1. Hypothyroidism-r/t Hashimotos thyroiditis  - Her repeat thyroid function tests show positive antibodies, confirming autoimmune etiology.    Her previsit thyroid function tests are consistent with appropriate hormone replacement.  She is advised to continue her Levothyroxine 50 mcg and 25 mcg po on alternating days.  May consider trying 75 mcg tabs and cutting them in half if she continues to  have symptoms of over-replacement on days she takes the 50 mcg.   - The correct intake of thyroid hormone (Levothyroxine, Synthroid), is on empty stomach first thing in the morning, with water, separated by at least 30 minutes from breakfast and other medications,  and separated by more than 4 hours from calcium, iron, multivitamins, acid reflux medications (PPIs).  - This medication is a life-long medication and will be needed to correct thyroid hormone imbalances for the rest of your life.  The dose may change from time to time, based on thyroid blood work.  - It is extremely important to be consistent taking this medication, near the same time each morning.  -AVOID TAKING PRODUCTS CONTAINING BIOTIN (commonly  found in Hair, Skin, Nails vitamins) AS IT INTERFERES WITH THE VALIDITY OF THYROID FUNCTION BLOOD TESTS.     I spent  19  minutes in the care of the patient today including review of labs from Thyroid Function, CMP, and other relevant labs ; imaging/biopsy records (current and previous including abstractions from other facilities); face-to-face time discussing  her lab results and symptoms, medications doses, her options of short and long term treatment based on the latest standards of care / guidelines;   and documenting the encounter.  Burman Blacksmith Gordy  participated in the discussions, expressed understanding, and voiced agreement with the above plans.  All questions were answered to her satisfaction. she is encouraged to contact clinic should she have any questions or concerns prior to her return visit.   FOLLOW UP PLAN:  Return in about 3 months (around 02/23/2023) for Thyroid follow up, Previsit labs.  Rayetta Pigg, [facility] [address] [place], [place] [address] Phone: [phone] Fax: [phone]  11/23/2022, 3:04 PM

## 2022-11-24 ENCOUNTER — Other Ambulatory Visit
Admission: RE | Admit: 2022-11-24 | Discharge: 2022-11-24 | Disposition: A | Discharge status: Home / Self Care | Payer: 59 | Source: Ambulatory Visit | Attending: Specialist | Admitting: Specialist

## 2022-11-24 DIAGNOSIS — R0789 Other chest pain: Secondary | ICD-10-CM | POA: Insufficient documentation

## 2022-11-24 LAB — D-DIMER, QUANTITATIVE: D-Dimer, Quant: 2.85 ug/mL-FEU — ABNORMAL HIGH (ref 0.00–0.50)

## 2022-11-25 ENCOUNTER — Emergency Department
Admission: EM | Admit: 2022-11-25 | Discharge: 2022-11-25 | Disposition: A | Discharge status: Home / Self Care | Payer: 59 | Attending: Emergency Medicine | Admitting: Emergency Medicine

## 2022-11-25 ENCOUNTER — Other Ambulatory Visit: Payer: Self-pay

## 2022-11-25 ENCOUNTER — Emergency Department: Payer: 59

## 2022-11-25 ENCOUNTER — Encounter: Payer: Self-pay | Admitting: Emergency Medicine

## 2022-11-25 DIAGNOSIS — R072 Precordial pain: Secondary | ICD-10-CM | POA: Diagnosis not present

## 2022-11-25 DIAGNOSIS — E039 Hypothyroidism, unspecified: Secondary | ICD-10-CM | POA: Insufficient documentation

## 2022-11-25 DIAGNOSIS — K859 Acute pancreatitis without necrosis or infection, unspecified: Secondary | ICD-10-CM | POA: Insufficient documentation

## 2022-11-25 DIAGNOSIS — I10 Essential (primary) hypertension: Secondary | ICD-10-CM | POA: Insufficient documentation

## 2022-11-25 DIAGNOSIS — R748 Abnormal levels of other serum enzymes: Secondary | ICD-10-CM | POA: Insufficient documentation

## 2022-11-25 DIAGNOSIS — Z79899 Other long term (current) drug therapy: Secondary | ICD-10-CM | POA: Diagnosis not present

## 2022-11-25 DIAGNOSIS — R079 Chest pain, unspecified: Secondary | ICD-10-CM

## 2022-11-25 DIAGNOSIS — R791 Abnormal coagulation profile: Secondary | ICD-10-CM | POA: Insufficient documentation

## 2022-11-25 LAB — COMPREHENSIVE METABOLIC PANEL
ALT: 17 U/L (ref 0–44)
AST: 34 U/L (ref 15–41)
Albumin: 3.7 g/dL (ref 3.5–5.0)
Alkaline Phosphatase: 54 U/L (ref 38–126)
Anion gap: 6 (ref 5–15)
BUN: 15 mg/dL (ref 6–20)
CO2: 22 mmol/L (ref 22–32)
Calcium: 9.3 mg/dL (ref 8.9–10.3)
Chloride: 110 mmol/L (ref 98–111)
Creatinine, Ser: 0.67 mg/dL (ref 0.44–1.00)
GFR, Estimated: >60 mL/min (ref >60)
Glucose, Bld: 97 mg/dL (ref 70–99)
Potassium: 3.8 mmol/L (ref 3.5–5.1)
Sodium: 138 mmol/L (ref 135–145)
Total Bilirubin: 0.8 mg/dL (ref 0.3–1.2)
Total Protein: 6.8 g/dL (ref 6.5–8.1)

## 2022-11-25 LAB — CBC WITH DIFFERENTIAL/PLATELET
Abs Immature Granulocytes: 0.01 10*3/uL (ref 0.00–0.07)
Basophils Absolute: 0 10*3/uL (ref 0.0–0.1)
Basophils Relative: 0 %
Eosinophils Absolute: 0.1 10*3/uL (ref 0.0–0.5)
Eosinophils Relative: 2 %
HCT: 44 % (ref 36.0–46.0)
Hemoglobin: 14.4 g/dL (ref 12.0–15.0)
Immature Granulocytes: 0 %
Lymphocytes Relative: 49 %
Lymphs Abs: 3.4 10*3/uL (ref 0.7–4.0)
MCH: 29.1 pg (ref 26.0–34.0)
MCHC: 32.7 g/dL (ref 30.0–36.0)
MCV: 89.1 fL (ref 80.0–100.0)
Monocytes Absolute: 0.5 10*3/uL (ref 0.1–1.0)
Monocytes Relative: 7 %
Neutro Abs: 3 10*3/uL (ref 1.7–7.7)
Neutrophils Relative %: 42 %
Platelets: 286 10*3/uL (ref 150–400)
RBC: 4.94 MIL/uL (ref 3.87–5.11)
RDW: 12.8 % (ref 11.5–15.5)
WBC: 7 10*3/uL (ref 4.0–10.5)
nRBC: 0 % (ref 0.0–0.2)

## 2022-11-25 LAB — HCG, QUANTITATIVE, PREGNANCY: hCG, Beta Chain, Quant, S: 1 m[IU]/mL (ref <5)

## 2022-11-25 LAB — TSH: TSH: 4.127 u[IU]/mL (ref 0.350–4.500)

## 2022-11-25 LAB — T4, FREE: Free T4: 0.81 ng/dL (ref 0.61–1.12)

## 2022-11-25 LAB — TROPONIN I (HIGH SENSITIVITY)
Troponin I (High Sensitivity): 41 ng/L — ABNORMAL HIGH (ref <18)
Troponin I (High Sensitivity): 42 ng/L — ABNORMAL HIGH (ref <18)

## 2022-11-25 LAB — LIPASE, BLOOD: Lipase: 125 U/L — ABNORMAL HIGH (ref 11–51)

## 2022-11-25 MED ORDER — ALUM & MAG HYDROXIDE-SIMETH 200-200-20 MG/5ML PO SUSP
30.0000 mL | Freq: Once | ORAL | Status: AC
  Administered 2022-11-25: 30 mL via ORAL
  Filled 2022-11-25: qty 30

## 2022-11-25 MED ORDER — IOHEXOL 350 MG/ML SOLN
100.0000 mL | Freq: Once | INTRAVENOUS | Status: AC | PRN
  Administered 2022-11-25: 100 mL via INTRAVENOUS

## 2022-11-25 MED ORDER — LIDOCAINE VISCOUS HCL 2 % MT SOLN
15.0000 mL | Freq: Once | OROMUCOSAL | Status: AC
  Administered 2022-11-25: 15 mL via OROMUCOSAL
  Filled 2022-11-25: qty 15

## 2022-11-25 MED ORDER — ONDANSETRON HCL 4 MG/2ML IJ SOLN
4.0000 mg | Freq: Once | INTRAMUSCULAR | Status: AC
  Administered 2022-11-25: 4 mg via INTRAVENOUS
  Filled 2022-11-25: qty 2

## 2022-11-25 MED ORDER — MORPHINE SULFATE (PF) 2 MG/ML IV SOLN
2.0000 mg | Freq: Once | INTRAVENOUS | Status: AC
  Administered 2022-11-25: 2 mg via INTRAVENOUS
  Filled 2022-11-25: qty 1

## 2022-11-25 MED ORDER — SODIUM CHLORIDE 0.9 % IV BOLUS
500.0000 mL | Freq: Once | INTRAVENOUS | Status: AC
  Administered 2022-11-25: 500 mL via INTRAVENOUS

## 2022-11-25 MED ORDER — FAMOTIDINE IN NACL 20-0.9 MG/50ML-% IV SOLN
20.0000 mg | Freq: Once | INTRAVENOUS | Status: AC
  Administered 2022-11-25: 20 mg via INTRAVENOUS
  Filled 2022-11-25: qty 50

## 2022-11-25 NOTE — ED Triage Notes (Signed)
Pt presents ambulatory to triage via POV with complaints of "stabbing" mid-sternal CP that woke her up out of her sleep this AM. Rates the pain 9/10. No meds taken PTA.  States the pain is localized in the middle of her chest without radiation. A&Ox4 at this time. Denies SOB, fevers, N/V/D,

## 2022-11-25 NOTE — ED Provider Notes (Signed)
[facility] Provider Note    Event Date/Time   First MD Initiated Contact with Patient 11/25/22 [phone]     (approximate)   History   Chest Pain   HPI  Jennifer Mclaughlin is a 52 y.o. female who presents to the ED from home with a chief complaint of chest pain.  Patient was awakened around 5 AM with sharp, stabbing midsternal chest pain radiating to her back.  Denies associated diaphoresis, palpitations, nausea/vomiting or dizziness.  Denies recent fever, cough, shortness of breath.  Had a scheduled visit with her pulmonologist recently because she coughed up blood while brushing her teeth 1 time and was experiencing right-sided pain.  She had lab work drawn and had a positive D-dimer.  Has not yet had a chest scan.  Ate barbecue chicken for dinner last night.     Past Medical History   Past Medical History:  Diagnosis Date   Anemia    Hashimoto's disease    Hypertension    Hypothyroidism      Active Problem List   Patient Active Problem List   Diagnosis Date Noted   Chest pain 02/26/2022   GERD (gastroesophageal reflux disease) 02/26/2022   IBS (irritable bowel syndrome) 02/26/2022   Insomnia 02/26/2022   Seasonal allergies 02/26/2022     Past Surgical History   Past Surgical History:  Procedure Laterality Date   CHOLECYSTECTOMY     COLONOSCOPY WITH PROPOFOL N/A 08/28/2022   Procedure: COLONOSCOPY WITH PROPOFOL;  Surgeon: Lesly Rubenstein, MD;  Location: [facility] ENDOSCOPY;  Service: Endoscopy;  Laterality: N/A;   ESOPHAGOGASTRODUODENOSCOPY (EGD) WITH PROPOFOL N/A 08/28/2022   Procedure: ESOPHAGOGASTRODUODENOSCOPY (EGD) WITH PROPOFOL;  Surgeon: Lesly Rubenstein, MD;  Location: [facility] ENDOSCOPY;  Service: Endoscopy;  Laterality: N/A;   NASAL SINUS SURGERY     TONSILLECTOMY     TUBAL LIGATION       Home Medications   Prior to Admission medications   Medication Sig Start Date End Date Taking? Authorizing Provider  ALPRAZolam (XANAX) 0.5  MG tablet TAKE 1 TABLET BY MOUTH NIGHTLY AS NEEDED FOR SLEEP 06/19/22     cetirizine (ZYRTEC) 10 MG tablet Take 10 mg by mouth daily.    [provider]  cyanocobalamin (,VITAMIN B-12,) 1000 MCG/ML injection Inject 1 mL (1,000 mcg total) into the muscle every 14 (fourteen) days 03/05/21     cyanocobalamin (VITAMIN B12) 1000 MCG/ML injection Inject 1 mL (1,000 mcg total) into the muscle every 14 (fourteen) days [id number]     folic acid (FOLVITE) 1 MG tablet Take 1 tablet (1 mg total) by mouth daily. 10/07/22     methotrexate (RHEUMATREX) 2.5 MG tablet Take 5 tablets (12.5 mg total) by mouth every 7 (seven) days. All on the same day. 10/07/22     omeprazole (PRILOSEC) 20 MG capsule Take 1 capsule (20 mg total) by mouth daily. 07/08/22     omeprazole (PRILOSEC) 20 MG capsule Take 1 capsule (20 mg total) by mouth daily. 08/05/22     OVER THE COUNTER MEDICATION Vitality pack form natural oils - patient takes 1 by mouth daily    [provider]  predniSONE (DELTASONE) 5 MG tablet Take 4 tablets by mouth once daily for 7 days, 3 tablets daily for 7 days, 2 tablets daily for 7 days, THEN 1 tablet once daily for 7 days. Patient not taking: Reported on 11/23/2022 10/07/22     SYNTHROID 25 MCG tablet Take 1 tablet (25 mcg total) by mouth every  other day. 10/22/22   Brita Romp, NP  SYNTHROID 50 MCG tablet Take 1 tablet (50 mcg total) by mouth daily before breakfast. 09/23/22   Brita Romp, NP  Turmeric (QC TUMERIC COMPLEX PO) Take by mouth. Patient states that she takes daily    [provider]  hydrALAZINE (APRESOLINE) 10 MG tablet Take 1 tablet (10 mg total) by mouth 3 (three) times daily as needed (For blood pressure greater than 150/90) 05/28/22 08/20/22       Allergies  Biaxin [clarithromycin], Ceftin [cefuroxime axetil], Penicillins, Septra [sulfamethoxazole-trimethoprim], Sulfa antibiotics, Amoxicillin, Dicyclomine, Hydrochlorothiazide, Linaclotide, Nebivolol, Telmisartan,  and Levothyroxine sodium   Family History   Family History  Problem Relation Age of Onset   Thyroid cancer Father    Breast cancer Neg Hx      Physical Exam  Triage Vital Signs: ED Triage Vitals  Enc Vitals Group     BP 11/25/22 0546 (!) 153/102     Pulse Rate 11/25/22 0546 81     Resp 11/25/22 0546 18     Temp 11/25/22 0546 98 F (36.7 C)     Temp src --      SpO2 11/25/22 0546 100 %     Weight --      Height --      Head Circumference --      Peak Flow --      Pain Score 11/25/22 0547 9     Pain Loc --      Pain Edu? --      Excl. in [place]? --     Updated Vital Signs: BP (!) 147/94   Pulse 69   Temp 98 F (36.7 C)   Resp 13   SpO2 96%    General: Awake, mild distress.  CV:  RRR.  Good peripheral perfusion.  Resp:  Normal effort.  CTAB. Abd:  Mild epigastric tenderness to palpation without rebound or guarding..  No distention.  No truncal vesicles. Other:  Bilateral calves are supple and nontender.   ED Results / Procedures / Treatments  Labs (all labs ordered are listed, but only abnormal results are displayed) Labs Reviewed  LIPASE, BLOOD - Abnormal; Notable for the following components:      Result Value   Lipase 125 (*)    All other components within normal limits  CBC WITH DIFFERENTIAL/PLATELET  COMPREHENSIVE METABOLIC PANEL  TSH  T4, FREE  TROPONIN I (HIGH SENSITIVITY)     EKG  ED ECG REPORT I, Tameaka Eichhorn J, the attending physician, personally viewed and interpreted this ECG.   Date: 11/25/2022  EKG Time: 0546  Rate: 82  Rhythm: normal sinus rhythm  Axis: Normal  Intervals:none  ST&T Change: Nonspecific    RADIOLOGY I have independently visualized and interpreted patient's x-ray and CTA chest as well as noted the radiology interpretation:  Chest x-ray: No acute cardiopulmonary process  CTA chest: Pending  CT abdomen/pelvis: Pending  Official radiology report(s): No results found.   PROCEDURES:  Critical Care performed:  No  .1-3 Lead EKG Interpretation  Performed by: Paulette Blanch, MD Authorized by: Paulette Blanch, MD     Interpretation: normal     ECG rate:  80   ECG rate assessment: normal     Rhythm: sinus rhythm     Ectopy: none     Conduction: normal   Comments:     Patient placed on cardiac monitor to evaluate for arrhythmias    MEDICATIONS ORDERED IN ED: Medications  sodium chloride 0.9 % bolus 500 mL (500 mLs Intravenous New Bag/Given 11/25/22 0606)  famotidine (PEPCID) IVPB 20 mg premix (20 mg Intravenous New Bag/Given 11/25/22 0615)  morphine (PF) 2 MG/ML injection 2 mg (2 mg Intravenous Given 11/25/22 0615)  ondansetron (ZOFRAN) injection 4 mg (4 mg Intravenous Given 11/25/22 0615)     IMPRESSION / MDM / ASSESSMENT AND PLAN / ED COURSE  I reviewed the triage vital signs and the nursing notes.                             52 year old female presenting with chest pain. Differential diagnosis includes, but is not limited to, ACS, aortic dissection, pulmonary embolism, cardiac tamponade, pneumothorax, pneumonia, pericarditis, myocarditis, GI-related causes including esophagitis/gastritis, and musculoskeletal chest wall pain.   Personally reviewed patient's records and note her pulmonology office visit from yesterday for hemoptysis and right-sided chest pain with positive D-dimer.  Patient's presentation is most consistent with acute presentation with potential threat to life or bodily function.  The patient is on the cardiac monitor to evaluate for evidence of arrhythmia and/or significant heart rate changes.  Will obtain cardiac panel, CTA chest given positive D-dimer in clinic.  Initiate IV fluids, IV morphine for pain.  With IV Zofran for nausea.  Add IV Pepcid and reassess.  Clinical Course as of 11/25/22 0647  Wed Nov 25, 2022  [id number] Pain improved after administration of IV morphine.  Updated patient of elevated lipase.  Will add CT abdomen/pelvis.  Care be transferred to the oncoming  provider at change of shift pending rest of lab results and CT scans. [JS]    Clinical Course User Index [JS] Paulette Blanch, MD     FINAL CLINICAL IMPRESSION(S) / ED DIAGNOSES   Final diagnoses:  Nonspecific chest pain  Acute pancreatitis, unspecified complication status, unspecified pancreatitis type     Rx / DC Orders   ED Discharge Orders     None        Note:  This document was prepared using Dragon voice recognition software and may include unintentional dictation errors.   Paulette Blanch, MD 11/25/22 [phone]

## 2022-11-25 NOTE — ED Notes (Signed)
Dr. Cheri Fowler asked for the pt to eat to see if the chest pain gets worse. Meal provided to pt and primary RN updated.

## 2022-11-30 ENCOUNTER — Other Ambulatory Visit: Payer: Self-pay

## 2022-12-08 ENCOUNTER — Encounter: Admission: RE | Payer: Self-pay | Source: Home / Self Care

## 2022-12-08 ENCOUNTER — Ambulatory Visit: Admission: RE | Admit: 2022-12-08 | Payer: 59 | Source: Home / Self Care | Admitting: Cardiology

## 2022-12-08 DIAGNOSIS — R0602 Shortness of breath: Secondary | ICD-10-CM

## 2022-12-08 SURGERY — RIGHT/LEFT HEART CATH AND CORONARY ANGIOGRAPHY
Anesthesia: Moderate Sedation | Laterality: Bilateral

## 2022-12-09 ENCOUNTER — Other Ambulatory Visit: Payer: Self-pay

## 2022-12-13 ENCOUNTER — Other Ambulatory Visit: Payer: Self-pay

## 2022-12-14 ENCOUNTER — Other Ambulatory Visit: Payer: Self-pay

## 2022-12-16 ENCOUNTER — Other Ambulatory Visit: Payer: Self-pay | Admitting: Nurse Practitioner

## 2022-12-16 ENCOUNTER — Other Ambulatory Visit: Payer: Self-pay

## 2022-12-16 MED ORDER — OMEPRAZOLE 20 MG PO CPDR
20.0000 mg | DELAYED_RELEASE_CAPSULE | Freq: Every day | ORAL | 1 refills | Status: DC
  Filled 2022-12-16 – 2023-01-06 (×2): qty 30, 30d supply, fill #0
  Filled 2023-01-28 – 2023-02-03 (×2): qty 30, 30d supply, fill #1

## 2022-12-16 MED ORDER — SYNTHROID 25 MCG PO TABS
25.0000 ug | ORAL_TABLET | ORAL | 0 refills | Status: DC
  Filled 2022-12-16 – 2023-01-06 (×2): qty 15, 30d supply, fill #0
  Filled 2023-01-28 – 2023-02-03 (×2): qty 15, 30d supply, fill #1
  Filled 2023-02-26: qty 15, 30d supply, fill #2

## 2022-12-18 ENCOUNTER — Other Ambulatory Visit: Payer: Self-pay

## 2022-12-21 ENCOUNTER — Other Ambulatory Visit: Payer: Self-pay

## 2022-12-21 MED ORDER — METHOTREXATE SODIUM 2.5 MG PO TABS
12.5000 mg | ORAL_TABLET | ORAL | 3 refills | Status: DC
  Filled 2022-12-21: qty 20, 28d supply, fill #0
  Filled 2023-01-06: qty 20, 28d supply, fill #1
  Filled 2023-01-28 – 2023-02-03 (×2): qty 20, 28d supply, fill #2
  Filled 2023-02-26 (×2): qty 20, 28d supply, fill #3

## 2022-12-29 ENCOUNTER — Other Ambulatory Visit: Payer: Self-pay

## 2022-12-30 ENCOUNTER — Other Ambulatory Visit: Payer: Self-pay

## 2022-12-30 ENCOUNTER — Telehealth: Payer: Self-pay | Admitting: *Deleted

## 2022-12-30 DIAGNOSIS — E038 Other specified hypothyroidism: Secondary | ICD-10-CM

## 2022-12-30 MED ORDER — ALPRAZOLAM 0.5 MG PO TABS
0.5000 mg | ORAL_TABLET | Freq: Every evening | ORAL | 5 refills | Status: DC | PRN
  Filled 2022-12-30: qty 30, 30d supply, fill #0
  Filled 2023-01-28: qty 30, 30d supply, fill #1
  Filled 2023-02-26: qty 30, 30d supply, fill #2
  Filled 2023-03-25: qty 30, 30d supply, fill #3
  Filled 2023-04-24: qty 30, 30d supply, fill #4
  Filled 2023-06-06: qty 30, 30d supply, fill #5

## 2022-12-30 NOTE — Telephone Encounter (Signed)
Jennifer Mclaughlin left a message yesterday afternoon and this morning. She is asking that her Thyroid be checked again, she feels like ift is off again.

## 2022-12-30 NOTE — Telephone Encounter (Signed)
Patient was called and made aware. 

## 2022-12-30 NOTE — Telephone Encounter (Signed)
Yes, she can go ahead and have her thyroid labs done now (I have entered new orders).  I will contact her with the results.

## 2022-12-31 ENCOUNTER — Telehealth: Payer: Self-pay | Admitting: *Deleted

## 2022-12-31 LAB — T4, FREE: Free T4: 1.25 ng/dL (ref 0.82–1.77)

## 2022-12-31 LAB — TSH: TSH: 1.97 u[IU]/mL (ref 0.450–4.500)

## 2022-12-31 NOTE — Progress Notes (Signed)
Her labs look great!  Both values are right in the middle of the normal range, right where they should be.  I recommend staying on the same amount of thyroid hormone for now.

## 2022-12-31 NOTE — Progress Notes (Signed)
Patient was called and given results. 

## 2022-12-31 NOTE — Telephone Encounter (Signed)
-----   Message from Dani Gobble, NP sent at 12/31/2022  7:29 AM EDT ----- Her labs look great!  Both values are right in the middle of the normal range, right where they should be.  I recommend staying on the same amount of thyroid hormone for now.

## 2022-12-31 NOTE — Telephone Encounter (Signed)
Patient was called and given results. 

## 2023-01-05 ENCOUNTER — Ambulatory Visit
Admission: RE | Admit: 2023-01-05 | Discharge: 2023-01-05 | Disposition: A | Discharge status: Home / Self Care | Payer: 59 | Attending: Cardiology | Admitting: Cardiology

## 2023-01-05 ENCOUNTER — Encounter: Payer: Self-pay | Admitting: Cardiology

## 2023-01-05 ENCOUNTER — Encounter
Admission: RE | Disposition: A | Discharge status: Home / Self Care | Payer: Self-pay | Source: Home / Self Care | Attending: Cardiology

## 2023-01-05 DIAGNOSIS — I272 Pulmonary hypertension, unspecified: Secondary | ICD-10-CM | POA: Diagnosis not present

## 2023-01-05 DIAGNOSIS — Z01818 Encounter for other preprocedural examination: Secondary | ICD-10-CM

## 2023-01-05 DIAGNOSIS — R079 Chest pain, unspecified: Secondary | ICD-10-CM

## 2023-01-05 HISTORY — PX: RIGHT HEART CATH: CATH118263

## 2023-01-05 LAB — POCT I-STAT EG7
Acid-base deficit: 2 mmol/L (ref 0.0–2.0)
Bicarbonate: 23.6 mmol/L (ref 20.0–28.0)
Calcium, Ion: 1.26 mmol/L (ref 1.15–1.40)
HCT: 42 % (ref 36.0–46.0)
Hemoglobin: 14.3 g/dL (ref 12.0–15.0)
O2 Saturation: 56 %
Potassium: 3.8 mmol/L (ref 3.5–5.1)
Sodium: 139 mmol/L (ref 135–145)
TCO2: 25 mmol/L (ref 22–32)
pCO2, Ven: 43.5 mmHg — ABNORMAL LOW (ref 44–60)
pH, Ven: 7.342 (ref 7.25–7.43)
pO2, Ven: 31 mmHg — CL (ref 32–45)

## 2023-01-05 SURGERY — RIGHT HEART CATH
Anesthesia: Moderate Sedation | Laterality: Right

## 2023-01-05 MED ORDER — HYDRALAZINE HCL 20 MG/ML IJ SOLN: 10.0000 mg | INTRAMUSCULAR | Status: DC | PRN

## 2023-01-05 MED ORDER — SODIUM CHLORIDE 0.9% FLUSH: 3.0000 mL | Freq: Two times a day (BID) | INTRAVENOUS | Status: DC

## 2023-01-05 MED ORDER — SODIUM CHLORIDE 0.9 % IV SOLN: 250.0000 mL | INTRAVENOUS | Status: DC | PRN

## 2023-01-05 MED ORDER — LIDOCAINE HCL 1 % IJ SOLN
INTRAMUSCULAR | Status: AC
  Filled 2023-01-05: qty 20

## 2023-01-05 MED ORDER — HEPARIN (PORCINE) IN NACL 1000-0.9 UT/500ML-% IV SOLN
INTRAVENOUS | Status: DC | PRN
  Administered 2023-01-05: 1000 mL

## 2023-01-05 MED ORDER — ONDANSETRON HCL 4 MG/2ML IJ SOLN: 4.0000 mg | Freq: Four times a day (QID) | INTRAMUSCULAR | Status: DC | PRN

## 2023-01-05 MED ORDER — SODIUM CHLORIDE 0.9 % WEIGHT BASED INFUSION: 240.0000 mL/h | INTRAVENOUS | Status: AC

## 2023-01-05 MED ORDER — SODIUM CHLORIDE 0.9 % WEIGHT BASED INFUSION: 1.0000 mL/kg/h | INTRAVENOUS | Status: DC

## 2023-01-05 MED ORDER — SODIUM CHLORIDE 0.9% FLUSH: 3.0000 mL | INTRAVENOUS | Status: DC | PRN

## 2023-01-05 MED ORDER — FENTANYL CITRATE (PF) 100 MCG/2ML IJ SOLN
INTRAMUSCULAR | Status: AC
  Filled 2023-01-05: qty 2

## 2023-01-05 MED ORDER — SODIUM CHLORIDE 0.9 % WEIGHT BASED INFUSION: 80.0000 mL/h | INTRAVENOUS | Status: DC

## 2023-01-05 MED ORDER — HEPARIN (PORCINE) IN NACL 1000-0.9 UT/500ML-% IV SOLN
INTRAVENOUS | Status: AC
  Filled 2023-01-05: qty 1000

## 2023-01-05 MED ORDER — ACETAMINOPHEN 325 MG PO TABS: 650.0000 mg | ORAL_TABLET | ORAL | Status: DC | PRN

## 2023-01-05 MED ORDER — MIDAZOLAM HCL 2 MG/2ML IJ SOLN
INTRAMUSCULAR | Status: AC
  Filled 2023-01-05: qty 2

## 2023-01-05 MED ORDER — LIDOCAINE HCL (PF) 1 % IJ SOLN
INTRAMUSCULAR | Status: DC | PRN
  Administered 2023-01-05: 2 mL

## 2023-01-05 MED ORDER — MIDAZOLAM HCL 2 MG/2ML IJ SOLN
INTRAMUSCULAR | Status: DC | PRN
  Administered 2023-01-05: 1 mg via INTRAVENOUS

## 2023-01-05 MED ORDER — FENTANYL CITRATE (PF) 100 MCG/2ML IJ SOLN
INTRAMUSCULAR | Status: DC | PRN
  Administered 2023-01-05: 25 ug via INTRAVENOUS

## 2023-01-05 MED ORDER — ASPIRIN 81 MG PO CHEW: 81.0000 mg | CHEWABLE_TABLET | ORAL | Status: DC

## 2023-01-05 SURGICAL SUPPLY — 9 items
CATH BALLN WEDGE 5F 110CM (CATHETERS) IMPLANT
DRAPE BRACHIAL (DRAPES) IMPLANT
GUIDEWIRE EMER 3M J .025X150CM (WIRE) IMPLANT
KIT SYRINGE INJ CVI SPIKEX1 (MISCELLANEOUS) IMPLANT
PACK CARDIAC CATH (CUSTOM PROCEDURE TRAY) ×1 IMPLANT
PROTECTION STATION PRESSURIZED (MISCELLANEOUS) ×1
SET ATX-X65L (MISCELLANEOUS) IMPLANT
SHEATH GLIDE SLENDER 4/5FR (SHEATH) IMPLANT
STATION PROTECTION PRESSURIZED (MISCELLANEOUS) IMPLANT

## 2023-01-05 NOTE — Discharge Instructions (Signed)
Right Heart Cath, Care After This sheet gives you information about how to care for yourself after your procedure. Your health care provider may also give you more specific instructions. If you have problems or questions, contact your health care provider. What can I expect after the procedure? After the procedure, it is common to have: Bruising or mild discomfort in the area where the IV was inserted (insertion site). Follow these instructions at home: Eating and drinking  You may eat and drink after your procedure.  Drink a lot of fluids for the first several days after the procedure, as directed by your health care provider. This helps to wash (flush) the contrast out of your body. Examples of healthy fluids include water or low-calorie drinks. General instructions Check your IV insertion area and also your venous access site every day for signs of infection. Check for: Redness, swelling, or pain. Fluid or blood. Warmth. Pus or a bad smell. Take over-the-counter and prescription medicines only as told by your health care provider. Rest and return to your normal activities as told by your health care provider. Ask your health care provider what activities are safe for you. Do not drive for 24 hours if you were given a medicine to help you relax (sedative), or until your health care provider approves. Keep all follow-up visits as told by your health care provider. This is important. Contact a health care provider if: Your skin becomes itchy or you develop a rash or hives. You have a fever that does not get better with medicine. You feel nauseous. You vomit. You have redness, swelling, or pain around the insertion site. You have fluid or blood coming from the insertion site. Your insertion area feels warm to the touch. You have pus or a bad smell coming from the insertion site. Get help right away if: You have difficulty breathing or shortness of breath. You develop chest pain. You  faint. You feel very dizzy. These symptoms may represent a serious problem that is an emergency. Do not wait to see if the symptoms will go away. Get medical help right away. Call your local emergency services (911 in the [place] Do not drive yourself to the hospital. Summary After your procedure, it is common to have bruising or mild discomfort in the area where the IV was inserted. You should check your IV insertion area every day for signs of infection. Take over-the-counter and prescription medicines only as told by your health care provider. You should drink a lot of fluids for the first several days after the procedure to help flush the contrast from your body. This information is not intended to replace advice given to you by your health care provider. Make sure you discuss any questions you have with your health care provider. Document Released: 06/07/2013 Document Revised: 07/30/2017 Document Reviewed: 07/11/2016 [employer]vier Patient Education  2020 [employer] 

## 2023-01-06 ENCOUNTER — Other Ambulatory Visit: Payer: Self-pay

## 2023-01-06 ENCOUNTER — Encounter: Payer: Self-pay | Admitting: Cardiology

## 2023-01-06 ENCOUNTER — Other Ambulatory Visit (HOSPITAL_COMMUNITY): Payer: Self-pay

## 2023-01-06 MED ORDER — TADALAFIL (PAH) 20 MG PO TABS
20.0000 mg | ORAL_TABLET | Freq: Every day | ORAL | 1 refills | Status: AC
  Filled 2023-01-06 – 2023-10-04 (×11): qty 90, 90d supply, fill #0
  Filled 2023-10-05: qty 30, 30d supply, fill #0

## 2023-01-06 MED ORDER — TADALAFIL 20 MG PO TABS
20.0000 mg | ORAL_TABLET | Freq: Every day | ORAL | 0 refills | Status: DC
  Filled 2023-01-06: qty 30, 30d supply, fill #0

## 2023-01-08 ENCOUNTER — Other Ambulatory Visit: Payer: Self-pay

## 2023-01-12 ENCOUNTER — Other Ambulatory Visit: Payer: Self-pay

## 2023-01-22 ENCOUNTER — Other Ambulatory Visit: Payer: Self-pay

## 2023-01-28 ENCOUNTER — Other Ambulatory Visit: Payer: Self-pay

## 2023-02-01 ENCOUNTER — Other Ambulatory Visit: Payer: Self-pay | Admitting: Unknown Physician Specialty

## 2023-02-01 DIAGNOSIS — R221 Localized swelling, mass and lump, neck: Secondary | ICD-10-CM

## 2023-02-02 ENCOUNTER — Other Ambulatory Visit: Payer: Self-pay

## 2023-02-02 MED ORDER — FUROSEMIDE 20 MG PO TABS
20.0000 mg | ORAL_TABLET | Freq: Every day | ORAL | 1 refills | Status: DC | PRN
  Filled 2023-02-02 (×2): qty 30, 30d supply, fill #0
  Filled 2023-03-25: qty 30, 30d supply, fill #1

## 2023-02-03 ENCOUNTER — Other Ambulatory Visit: Payer: Self-pay

## 2023-02-03 ENCOUNTER — Other Ambulatory Visit (HOSPITAL_BASED_OUTPATIENT_CLINIC_OR_DEPARTMENT_OTHER): Payer: Self-pay

## 2023-02-03 MED ORDER — CYANOCOBALAMIN 1000 MCG/ML IJ SOLN
1000.0000 ug | INTRAMUSCULAR | 5 refills | Status: DC
  Filled 2023-02-03: qty 2, 28d supply, fill #0
  Filled 2023-02-26 (×2): qty 2, 28d supply, fill #1
  Filled 2023-03-25: qty 2, 28d supply, fill #2
  Filled 2023-04-07 – 2023-04-24 (×2): qty 2, 28d supply, fill #3
  Filled 2023-05-28: qty 2, 28d supply, fill #4
  Filled 2023-06-06 – 2023-06-23 (×2): qty 2, 28d supply, fill #5

## 2023-02-08 ENCOUNTER — Ambulatory Visit
Admission: RE | Admit: 2023-02-08 | Discharge: 2023-02-08 | Disposition: A | Discharge status: Home / Self Care | Payer: 59 | Source: Ambulatory Visit | Attending: Unknown Physician Specialty | Admitting: Unknown Physician Specialty

## 2023-02-08 DIAGNOSIS — R221 Localized swelling, mass and lump, neck: Secondary | ICD-10-CM

## 2023-02-08 MED ORDER — IOPAMIDOL (ISOVUE-300) INJECTION 61%
75.0000 mL | Freq: Once | INTRAVENOUS | Status: AC | PRN
  Administered 2023-02-08: 75 mL via INTRAVENOUS

## 2023-02-15 ENCOUNTER — Other Ambulatory Visit: Payer: Self-pay | Admitting: Dermatology

## 2023-02-15 DIAGNOSIS — L650 Telogen effluvium: Secondary | ICD-10-CM

## 2023-02-19 ENCOUNTER — Other Ambulatory Visit: Payer: Self-pay

## 2023-02-26 ENCOUNTER — Other Ambulatory Visit: Payer: Self-pay

## 2023-02-26 MED ORDER — OMEPRAZOLE 20 MG PO CPDR
20.0000 mg | DELAYED_RELEASE_CAPSULE | Freq: Every day | ORAL | 1 refills | Status: DC
  Filled 2023-02-26: qty 30, 30d supply, fill #0
  Filled 2023-04-07 – 2023-04-24 (×2): qty 30, 30d supply, fill #1

## 2023-03-08 ENCOUNTER — Other Ambulatory Visit: Payer: Self-pay

## 2023-03-08 MED ORDER — TRIAMCINOLONE ACETONIDE 0.5 % EX OINT
TOPICAL_OINTMENT | Freq: Two times a day (BID) | CUTANEOUS | 5 refills | Status: DC
  Filled 2023-03-08: qty 30, 10d supply, fill #0
  Filled 2023-03-15 – 2023-03-16 (×2): qty 30, 10d supply, fill #1
  Filled 2023-04-07: qty 30, 10d supply, fill #2
  Filled 2023-04-24: qty 30, 10d supply, fill #3
  Filled 2023-05-14: qty 30, 10d supply, fill #4
  Filled 2023-05-28: qty 30, 10d supply, fill #5

## 2023-03-08 MED ORDER — METHOTREXATE SODIUM 2.5 MG PO TABS
12.5000 mg | ORAL_TABLET | ORAL | 1 refills | Status: DC
  Filled 2023-03-08 – 2023-03-25 (×2): qty 20, 28d supply, fill #0
  Filled 2023-04-24: qty 20, 28d supply, fill #1
  Filled 2023-05-28: qty 20, 28d supply, fill #2
  Filled 2023-06-06 – 2023-06-23 (×2): qty 20, 28d supply, fill #3
  Filled 2023-07-08: qty 20, 28d supply, fill #4
  Filled 2023-08-05 – 2023-08-10 (×2): qty 20, 28d supply, fill #5

## 2023-03-12 LAB — T4, FREE: Free T4: 1.12 ng/dL (ref 0.82–1.77)

## 2023-03-12 LAB — TSH: TSH: 3.75 u[IU]/mL (ref 0.450–4.500)

## 2023-03-15 ENCOUNTER — Other Ambulatory Visit: Payer: Self-pay

## 2023-03-16 ENCOUNTER — Telehealth: Payer: Self-pay | Admitting: Nurse Practitioner

## 2023-03-16 DIAGNOSIS — E038 Other specified hypothyroidism: Secondary | ICD-10-CM

## 2023-03-16 NOTE — Telephone Encounter (Signed)
Yes, she will need to redo them since its that far out.  Ill enter the orders.

## 2023-03-16 NOTE — Telephone Encounter (Signed)
Pt has moved her appt from tmrw to sept. Do you want her to re do labs then

## 2023-03-17 ENCOUNTER — Ambulatory Visit: Payer: 59 | Admitting: Nurse Practitioner

## 2023-03-23 ENCOUNTER — Other Ambulatory Visit: Payer: Self-pay

## 2023-03-23 ENCOUNTER — Other Ambulatory Visit: Payer: Self-pay | Admitting: Nurse Practitioner

## 2023-03-25 ENCOUNTER — Other Ambulatory Visit: Payer: Self-pay

## 2023-03-25 ENCOUNTER — Other Ambulatory Visit: Payer: Self-pay | Admitting: Nurse Practitioner

## 2023-03-25 MED ORDER — SYNTHROID 50 MCG PO TABS
50.0000 ug | ORAL_TABLET | Freq: Every day | ORAL | 1 refills | Status: DC
  Filled 2023-03-25: qty 90, 90d supply, fill #0
  Filled 2023-04-06: qty 30, 30d supply, fill #1
  Filled 2023-04-24 – 2023-06-23 (×2): qty 90, 90d supply, fill #1

## 2023-03-25 NOTE — Telephone Encounter (Signed)
Pt is calling to check status of her refill on her thyroid medication Can this be sent in today

## 2023-03-29 ENCOUNTER — Other Ambulatory Visit: Payer: Self-pay

## 2023-04-05 ENCOUNTER — Other Ambulatory Visit: Payer: Self-pay | Admitting: Nurse Practitioner

## 2023-04-05 ENCOUNTER — Other Ambulatory Visit: Payer: Self-pay

## 2023-04-05 MED FILL — Levothyroxine Sodium Tab 25 MCG: ORAL | 30 days supply | Qty: 15 | Fill #0 | Status: CN

## 2023-04-06 ENCOUNTER — Other Ambulatory Visit: Payer: Self-pay

## 2023-04-06 MED FILL — Levothyroxine Sodium Tab 25 MCG: ORAL | 30 days supply | Qty: 15 | Fill #0 | Status: AC

## 2023-04-06 MED FILL — Levothyroxine Sodium Tab 25 MCG: ORAL | 30 days supply | Qty: 15 | Fill #0 | Status: CN

## 2023-04-07 ENCOUNTER — Other Ambulatory Visit: Payer: Self-pay

## 2023-04-08 ENCOUNTER — Other Ambulatory Visit: Payer: Self-pay

## 2023-04-12 ENCOUNTER — Other Ambulatory Visit: Payer: Self-pay

## 2023-04-14 ENCOUNTER — Other Ambulatory Visit: Payer: Self-pay

## 2023-04-24 ENCOUNTER — Other Ambulatory Visit: Payer: Self-pay

## 2023-04-24 MED FILL — Levothyroxine Sodium Tab 25 MCG: ORAL | 30 days supply | Qty: 15 | Fill #1 | Status: AC

## 2023-04-25 ENCOUNTER — Other Ambulatory Visit: Payer: Self-pay

## 2023-04-26 ENCOUNTER — Other Ambulatory Visit: Payer: Self-pay

## 2023-04-26 MED ORDER — FUROSEMIDE 20 MG PO TABS
20.0000 mg | ORAL_TABLET | Freq: Every day | ORAL | 1 refills | Status: DC | PRN
  Filled 2023-04-26: qty 30, 30d supply, fill #0
  Filled 2023-06-06: qty 30, 30d supply, fill #1

## 2023-04-26 MED ORDER — CYANOCOBALAMIN 1000 MCG/ML IJ SOLN
1000.0000 ug | INTRAMUSCULAR | 0 refills | Status: DC
  Filled 2023-04-26: qty 1, 14d supply, fill #0

## 2023-04-26 MED ORDER — CYANOCOBALAMIN 1000 MCG/ML IJ SOLN
1000.0000 ug | INTRAMUSCULAR | 0 refills | Status: DC
  Filled 2023-04-26 – 2023-07-08 (×4): qty 1, 14d supply, fill #0

## 2023-05-04 ENCOUNTER — Other Ambulatory Visit: Payer: Self-pay

## 2023-05-14 ENCOUNTER — Other Ambulatory Visit: Payer: Self-pay

## 2023-05-17 ENCOUNTER — Other Ambulatory Visit: Payer: Self-pay

## 2023-05-17 MED ORDER — LEVOTHYROXINE SODIUM 25 MCG PO TABS
25.0000 ug | ORAL_TABLET | ORAL | 1 refills | Status: AC
  Filled 2023-05-17 – 2023-05-28 (×2): qty 15, 30d supply, fill #0
  Filled 2023-06-06 – 2023-06-23 (×3): qty 15, 30d supply, fill #1
  Filled 2023-07-08 – 2023-08-05 (×3): qty 15, 30d supply, fill #2
  Filled 2023-08-22 – 2023-09-06 (×2): qty 15, 30d supply, fill #3
  Filled 2023-12-24 – 2023-12-28 (×3): qty 15, 30d supply, fill #4
  Filled 2024-01-25: qty 15, 30d supply, fill #5
  Filled 2024-02-20: qty 15, 30d supply, fill #6

## 2023-05-17 MED ORDER — LEVOTHYROXINE SODIUM 50 MCG PO TABS
50.0000 ug | ORAL_TABLET | ORAL | 3 refills | Status: DC
  Filled 2023-05-17: qty 15, 30d supply, fill #0
  Filled 2023-05-28 – 2023-06-18 (×3): qty 15, 30d supply, fill #1
  Filled 2023-06-23 – 2023-07-08 (×2): qty 15, 30d supply, fill #2
  Filled 2023-08-05 – 2023-08-11 (×2): qty 15, 30d supply, fill #3
  Filled 2023-08-22 – 2023-09-06 (×2): qty 15, 30d supply, fill #4
  Filled 2023-09-16: qty 90, 180d supply, fill #4
  Filled 2024-02-20: qty 90, 180d supply, fill #5

## 2023-05-18 ENCOUNTER — Other Ambulatory Visit: Payer: Self-pay | Admitting: Internal Medicine

## 2023-05-18 ENCOUNTER — Ambulatory Visit: Payer: 59 | Admitting: Nurse Practitioner

## 2023-05-18 DIAGNOSIS — Z1231 Encounter for screening mammogram for malignant neoplasm of breast: Secondary | ICD-10-CM

## 2023-05-21 ENCOUNTER — Other Ambulatory Visit: Payer: Self-pay

## 2023-05-21 ENCOUNTER — Other Ambulatory Visit: Payer: Self-pay | Admitting: Nurse Practitioner

## 2023-05-24 ENCOUNTER — Other Ambulatory Visit: Payer: Self-pay

## 2023-05-28 ENCOUNTER — Other Ambulatory Visit: Payer: Self-pay

## 2023-05-28 MED ORDER — OMEPRAZOLE 20 MG PO CPDR
20.0000 mg | DELAYED_RELEASE_CAPSULE | Freq: Every day | ORAL | 1 refills | Status: AC
  Filled 2023-05-28: qty 90, 90d supply, fill #0
  Filled 2023-06-23 – 2023-08-31 (×3): qty 90, 90d supply, fill #1

## 2023-06-01 ENCOUNTER — Ambulatory Visit: Payer: 59 | Admitting: Nurse Practitioner

## 2023-06-06 ENCOUNTER — Other Ambulatory Visit: Payer: Self-pay

## 2023-06-06 MED FILL — Levothyroxine Sodium Tab 25 MCG: ORAL | 30 days supply | Qty: 15 | Fill #2 | Status: CN

## 2023-06-07 ENCOUNTER — Other Ambulatory Visit: Payer: Self-pay

## 2023-06-10 ENCOUNTER — Other Ambulatory Visit: Payer: Self-pay

## 2023-06-10 MED ORDER — METRONIDAZOLE 0.75 % VA GEL
1.0000 | Freq: Every day | VAGINAL | 0 refills | Status: AC
  Filled 2023-06-10: qty 70, 5d supply, fill #0
  Filled 2023-06-10: qty 70, 7d supply, fill #0

## 2023-06-18 ENCOUNTER — Other Ambulatory Visit: Payer: Self-pay

## 2023-06-23 ENCOUNTER — Other Ambulatory Visit: Payer: Self-pay

## 2023-06-24 ENCOUNTER — Other Ambulatory Visit: Payer: Self-pay

## 2023-06-24 MED ORDER — ALPRAZOLAM 0.5 MG PO TABS
0.5000 mg | ORAL_TABLET | Freq: Every evening | ORAL | 5 refills | Status: DC | PRN
  Filled 2023-07-08: qty 30, 30d supply, fill #0
  Filled 2023-07-22 – 2023-08-05 (×2): qty 30, 30d supply, fill #1
  Filled 2023-08-22 – 2023-09-09 (×2): qty 30, 30d supply, fill #2
  Filled 2023-10-04: qty 30, 30d supply, fill #3
  Filled 2023-10-21 – 2023-10-31 (×2): qty 30, 30d supply, fill #4
  Filled 2023-11-30: qty 30, 30d supply, fill #5

## 2023-06-28 ENCOUNTER — Other Ambulatory Visit: Payer: Self-pay

## 2023-06-28 MED ORDER — TRIAMCINOLONE ACETONIDE 0.5 % EX OINT
TOPICAL_OINTMENT | Freq: Two times a day (BID) | CUTANEOUS | 5 refills | Status: AC
  Filled 2023-06-28: qty 30, 15d supply, fill #0
  Filled 2023-07-08: qty 30, 15d supply, fill #1
  Filled 2023-08-05: qty 30, 15d supply, fill #2
  Filled 2023-08-22: qty 30, 15d supply, fill #3
  Filled 2023-09-06: qty 30, 15d supply, fill #4
  Filled 2023-09-24: qty 30, 15d supply, fill #5

## 2023-06-29 ENCOUNTER — Other Ambulatory Visit: Payer: Self-pay

## 2023-06-30 ENCOUNTER — Other Ambulatory Visit: Payer: Self-pay

## 2023-06-30 MED ORDER — NALTREXONE-BUPROPION HCL ER 8-90 MG PO TB12
ORAL_TABLET | ORAL | 0 refills | Status: AC
  Filled 2023-06-30: qty 70, 28d supply, fill #0
  Filled 2023-06-30 – 2023-07-01 (×2): qty 70, 30d supply, fill #0
  Filled 2023-07-08: qty 70, 28d supply, fill #0

## 2023-07-01 ENCOUNTER — Other Ambulatory Visit: Payer: Self-pay

## 2023-07-05 ENCOUNTER — Other Ambulatory Visit: Payer: Self-pay

## 2023-07-05 MED ORDER — PHENDIMETRAZINE TARTRATE 35 MG PO TABS
35.0000 mg | ORAL_TABLET | Freq: Three times a day (TID) | ORAL | 0 refills | Status: AC
  Filled 2023-07-05: qty 90, 30d supply, fill #0

## 2023-07-06 ENCOUNTER — Other Ambulatory Visit: Payer: Self-pay

## 2023-07-07 ENCOUNTER — Other Ambulatory Visit: Payer: Self-pay

## 2023-07-08 ENCOUNTER — Other Ambulatory Visit: Payer: Self-pay | Admitting: Nurse Practitioner

## 2023-07-08 ENCOUNTER — Other Ambulatory Visit: Payer: Self-pay

## 2023-07-08 MED FILL — Levothyroxine Sodium Tab 25 MCG: ORAL | 30 days supply | Qty: 15 | Fill #2 | Status: AC

## 2023-07-09 ENCOUNTER — Other Ambulatory Visit: Payer: Self-pay

## 2023-07-09 MED ORDER — FUROSEMIDE 20 MG PO TABS
20.0000 mg | ORAL_TABLET | Freq: Every day | ORAL | 1 refills | Status: DC | PRN
  Filled 2023-07-09: qty 30, 30d supply, fill #0
  Filled 2023-07-22 – 2023-08-11 (×2): qty 30, 30d supply, fill #1

## 2023-07-11 ENCOUNTER — Other Ambulatory Visit: Payer: Self-pay

## 2023-07-11 MED ORDER — CYANOCOBALAMIN 1000 MCG/ML IJ SOLN
1000.0000 ug | INTRAMUSCULAR | 5 refills | Status: AC
  Filled 2023-07-22 – 2023-08-02 (×2): qty 2, 28d supply, fill #0
  Filled 2023-08-05 – 2023-09-06 (×2): qty 2, 28d supply, fill #1
  Filled 2023-10-01: qty 2, 28d supply, fill #2
  Filled 2023-10-31: qty 2, 28d supply, fill #3
  Filled 2023-11-25: qty 2, 28d supply, fill #4
  Filled 2024-02-20 – 2024-02-25 (×2): qty 2, 28d supply, fill #5

## 2023-07-13 ENCOUNTER — Ambulatory Visit
Admission: RE | Admit: 2023-07-13 | Discharge: 2023-07-13 | Disposition: A | Discharge status: Home / Self Care | Payer: 59 | Source: Ambulatory Visit | Attending: Internal Medicine | Admitting: Internal Medicine

## 2023-07-13 ENCOUNTER — Other Ambulatory Visit: Payer: Self-pay

## 2023-07-13 DIAGNOSIS — Z1231 Encounter for screening mammogram for malignant neoplasm of breast: Secondary | ICD-10-CM | POA: Insufficient documentation

## 2023-07-19 ENCOUNTER — Other Ambulatory Visit: Payer: Self-pay

## 2023-07-23 ENCOUNTER — Other Ambulatory Visit: Payer: Self-pay

## 2023-08-02 ENCOUNTER — Other Ambulatory Visit: Payer: Self-pay | Admitting: Unknown Physician Specialty

## 2023-08-02 ENCOUNTER — Other Ambulatory Visit: Payer: Self-pay

## 2023-08-02 DIAGNOSIS — R221 Localized swelling, mass and lump, neck: Secondary | ICD-10-CM

## 2023-08-05 ENCOUNTER — Other Ambulatory Visit: Payer: Self-pay | Admitting: Nurse Practitioner

## 2023-08-05 ENCOUNTER — Other Ambulatory Visit: Payer: Self-pay

## 2023-08-06 ENCOUNTER — Other Ambulatory Visit: Payer: Self-pay

## 2023-08-06 MED ORDER — CYANOCOBALAMIN 1000 MCG/ML IJ SOLN
1000.0000 ug | INTRAMUSCULAR | 0 refills | Status: AC
  Filled 2023-08-08 – 2024-02-14 (×4): qty 1, 14d supply, fill #0

## 2023-08-06 MED ORDER — CYANOCOBALAMIN 1000 MCG/ML IJ SOLN
1000.0000 ug | INTRAMUSCULAR | 0 refills | Status: DC
  Filled 2023-08-06 – 2023-08-22 (×3): qty 1, 14d supply, fill #0

## 2023-08-06 MED ORDER — SYNTHROID 50 MCG PO TABS
50.0000 ug | ORAL_TABLET | Freq: Every day | ORAL | 1 refills | Status: DC
  Filled 2023-08-06 – 2023-09-06 (×5): qty 90, 90d supply, fill #0
  Filled 2023-09-24: qty 30, 30d supply, fill #0
  Filled 2023-09-27: qty 90, 90d supply, fill #0
  Filled 2023-09-28: qty 30, 30d supply, fill #0
  Filled 2023-10-21: qty 30, 30d supply, fill #1
  Filled 2023-11-25: qty 30, 30d supply, fill #2
  Filled 2023-12-20: qty 30, 30d supply, fill #3
  Filled 2023-12-21: qty 90, 90d supply, fill #3

## 2023-08-09 ENCOUNTER — Other Ambulatory Visit: Payer: Self-pay

## 2023-08-09 ENCOUNTER — Ambulatory Visit
Admission: RE | Admit: 2023-08-09 | Discharge: 2023-08-09 | Disposition: A | Discharge status: Home / Self Care | Payer: 59 | Source: Ambulatory Visit | Attending: Unknown Physician Specialty | Admitting: Unknown Physician Specialty

## 2023-08-09 DIAGNOSIS — R221 Localized swelling, mass and lump, neck: Secondary | ICD-10-CM

## 2023-08-09 MED ORDER — IOPAMIDOL (ISOVUE-300) INJECTION 61%
75.0000 mL | Freq: Once | INTRAVENOUS | Status: AC | PRN
  Administered 2023-08-09: 75 mL via INTRAVENOUS

## 2023-08-11 ENCOUNTER — Other Ambulatory Visit: Payer: Self-pay

## 2023-08-11 ENCOUNTER — Other Ambulatory Visit: Payer: Self-pay | Admitting: Nurse Practitioner

## 2023-08-12 ENCOUNTER — Other Ambulatory Visit: Payer: Self-pay

## 2023-08-22 ENCOUNTER — Other Ambulatory Visit: Payer: Self-pay

## 2023-08-22 ENCOUNTER — Other Ambulatory Visit: Payer: Self-pay | Admitting: Nurse Practitioner

## 2023-08-23 ENCOUNTER — Other Ambulatory Visit: Payer: Self-pay | Admitting: Nurse Practitioner

## 2023-08-23 ENCOUNTER — Other Ambulatory Visit: Payer: Self-pay

## 2023-08-23 MED FILL — Levothyroxine Sodium Tab 25 MCG: ORAL | 90 days supply | Qty: 45 | Fill #0 | Status: CN

## 2023-08-24 ENCOUNTER — Other Ambulatory Visit: Payer: Self-pay

## 2023-08-24 MED ORDER — METHOTREXATE SODIUM 2.5 MG PO TABS
12.5000 mg | ORAL_TABLET | ORAL | 1 refills | Status: DC
  Filled 2023-08-24: qty 20, 28d supply, fill #0
  Filled 2023-09-06 – 2023-10-13 (×2): qty 20, 28d supply, fill #1
  Filled 2023-10-31: qty 20, 28d supply, fill #2

## 2023-08-26 MED FILL — Levothyroxine Sodium Tab 25 MCG: ORAL | 90 days supply | Qty: 45 | Fill #0 | Status: CN

## 2023-08-27 ENCOUNTER — Other Ambulatory Visit: Payer: Self-pay

## 2023-08-30 ENCOUNTER — Other Ambulatory Visit: Payer: Self-pay

## 2023-08-30 MED ORDER — CYANOCOBALAMIN 1000 MCG/ML IJ SOLN
1.0000 mg | INTRAMUSCULAR | 0 refills | Status: AC
  Filled 2023-09-06 – 2023-10-21 (×3): qty 1, 14d supply, fill #0

## 2023-08-31 ENCOUNTER — Other Ambulatory Visit: Payer: Self-pay

## 2023-09-06 ENCOUNTER — Other Ambulatory Visit: Payer: Self-pay

## 2023-09-06 MED FILL — Levothyroxine Sodium Tab 25 MCG: ORAL | 30 days supply | Qty: 15 | Fill #0 | Status: CN

## 2023-09-07 ENCOUNTER — Other Ambulatory Visit: Payer: Self-pay

## 2023-09-07 MED ORDER — FUROSEMIDE 20 MG PO TABS
20.0000 mg | ORAL_TABLET | Freq: Every day | ORAL | 1 refills | Status: AC | PRN
  Filled 2023-09-07: qty 30, 30d supply, fill #0
  Filled 2023-10-31: qty 30, 30d supply, fill #1

## 2023-09-07 MED ORDER — FUROSEMIDE 20 MG PO TABS: 20.0000 mg | ORAL_TABLET | Freq: Every day | ORAL | 1 refills | Status: AC | PRN

## 2023-09-07 MED ORDER — CYANOCOBALAMIN 1000 MCG/ML IJ SOLN
1000.0000 ug | INTRAMUSCULAR | 0 refills | Status: AC
  Filled 2023-09-07 – 2024-01-18 (×4): qty 1, 14d supply, fill #0

## 2023-09-07 MED ORDER — OMEPRAZOLE 20 MG PO CPDR
20.0000 mg | DELAYED_RELEASE_CAPSULE | Freq: Every day | ORAL | 3 refills | Status: DC
  Filled 2023-09-07: qty 90, 90d supply, fill #0
  Filled 2024-01-18: qty 30, 30d supply, fill #1
  Filled 2024-02-20: qty 30, 30d supply, fill #2
  Filled 2024-03-16: qty 30, 30d supply, fill #3
  Filled 2024-04-25: qty 30, 30d supply, fill #4
  Filled 2024-05-31: qty 30, 30d supply, fill #5
  Filled 2024-07-04: qty 30, 30d supply, fill #6
  Filled 2024-08-13: qty 30, 30d supply, fill #7

## 2023-09-09 ENCOUNTER — Other Ambulatory Visit: Payer: Self-pay

## 2023-09-16 ENCOUNTER — Other Ambulatory Visit: Payer: Self-pay

## 2023-09-17 ENCOUNTER — Other Ambulatory Visit: Payer: Self-pay

## 2023-09-24 ENCOUNTER — Other Ambulatory Visit: Payer: Self-pay

## 2023-09-27 ENCOUNTER — Other Ambulatory Visit: Payer: Self-pay

## 2023-09-28 ENCOUNTER — Other Ambulatory Visit: Payer: Self-pay

## 2023-10-01 ENCOUNTER — Other Ambulatory Visit: Payer: Self-pay

## 2023-10-01 MED FILL — Levothyroxine Sodium Tab 25 MCG: ORAL | 30 days supply | Qty: 15 | Fill #0 | Status: CN

## 2023-10-04 ENCOUNTER — Other Ambulatory Visit: Payer: Self-pay

## 2023-10-04 MED FILL — Levothyroxine Sodium Tab 25 MCG: ORAL | 30 days supply | Qty: 15 | Fill #0 | Status: AC

## 2023-10-05 ENCOUNTER — Other Ambulatory Visit: Payer: Self-pay

## 2023-10-06 ENCOUNTER — Other Ambulatory Visit: Payer: Self-pay

## 2023-10-06 ENCOUNTER — Other Ambulatory Visit (HOSPITAL_COMMUNITY): Payer: Self-pay

## 2023-10-13 ENCOUNTER — Other Ambulatory Visit: Payer: Self-pay

## 2023-10-21 ENCOUNTER — Other Ambulatory Visit: Payer: Self-pay

## 2023-10-21 MED FILL — Levothyroxine Sodium Tab 25 MCG: ORAL | 30 days supply | Qty: 15 | Fill #1 | Status: AC

## 2023-10-22 ENCOUNTER — Other Ambulatory Visit: Payer: Self-pay

## 2023-10-25 ENCOUNTER — Other Ambulatory Visit: Payer: Self-pay

## 2023-10-27 ENCOUNTER — Other Ambulatory Visit: Payer: Self-pay

## 2023-10-28 ENCOUNTER — Other Ambulatory Visit: Payer: Self-pay

## 2023-10-31 ENCOUNTER — Other Ambulatory Visit: Payer: Self-pay

## 2023-11-01 ENCOUNTER — Other Ambulatory Visit: Payer: Self-pay

## 2023-11-01 MED ORDER — CYANOCOBALAMIN 1000 MCG/ML IJ SOLN
1000.0000 ug | INTRAMUSCULAR | 0 refills | Status: AC
  Filled 2023-11-01 – 2024-01-03 (×2): qty 1, 14d supply, fill #0

## 2023-11-01 MED ORDER — CYANOCOBALAMIN 1000 MCG/ML IJ SOLN
1000.0000 ug | INTRAMUSCULAR | 0 refills | Status: DC
  Filled 2023-11-25 – 2023-12-21 (×3): qty 1, 14d supply, fill #0

## 2023-11-02 ENCOUNTER — Other Ambulatory Visit: Payer: Self-pay

## 2023-11-03 ENCOUNTER — Other Ambulatory Visit: Payer: Self-pay

## 2023-11-08 ENCOUNTER — Other Ambulatory Visit: Payer: Self-pay

## 2023-11-08 MED ORDER — AZITHROMYCIN 250 MG PO TABS
ORAL_TABLET | ORAL | 0 refills | Status: AC
  Filled 2023-11-08: qty 6, 5d supply, fill #0

## 2023-11-08 MED ORDER — HYDROCODONE BIT-HOMATROP MBR 5-1.5 MG/5ML PO SOLN
5.0000 mL | Freq: Every day | ORAL | 0 refills | Status: DC
  Filled 2023-11-08: qty 70, 14d supply, fill #0

## 2023-11-08 MED ORDER — PREDNISONE 20 MG PO TABS
20.0000 mg | ORAL_TABLET | Freq: Every day | ORAL | 0 refills | Status: AC
  Filled 2023-11-08: qty 5, 5d supply, fill #0

## 2023-11-09 ENCOUNTER — Other Ambulatory Visit: Payer: Self-pay

## 2023-11-09 MED ORDER — PROMETHAZINE-DM 6.25-15 MG/5ML PO SYRP
5.0000 mL | ORAL_SOLUTION | Freq: Four times a day (QID) | ORAL | 0 refills | Status: AC | PRN
  Filled 2023-11-09: qty 240, 12d supply, fill #0

## 2023-11-09 MED ORDER — FLUTICASONE PROPIONATE 50 MCG/ACT NA SUSP
2.0000 | Freq: Every day | NASAL | 11 refills | Status: AC
  Filled 2023-11-09: qty 16, 30d supply, fill #0
  Filled 2024-01-18: qty 16, 30d supply, fill #1
  Filled 2024-02-14: qty 16, 30d supply, fill #2
  Filled 2024-03-16: qty 16, 30d supply, fill #3
  Filled 2024-04-14: qty 16, 30d supply, fill #4
  Filled 2024-05-18: qty 16, 30d supply, fill #5
  Filled 2024-06-19: qty 16, 30d supply, fill #6
  Filled 2024-07-28: qty 16, 30d supply, fill #7
  Filled 2024-08-30: qty 16, 30d supply, fill #8
  Filled 2024-09-09: qty 16, 30d supply, fill #9

## 2023-11-10 ENCOUNTER — Other Ambulatory Visit: Payer: Self-pay

## 2023-11-10 MED ORDER — METHOTREXATE SODIUM 2.5 MG PO TABS
12.5000 mg | ORAL_TABLET | ORAL | 5 refills | Status: DC
  Filled 2023-11-10: qty 20, 28d supply, fill #0

## 2023-11-10 MED ORDER — FOLIC ACID 1 MG PO TABS
1.0000 mg | ORAL_TABLET | Freq: Every day | ORAL | 3 refills | Status: DC
  Filled 2023-11-10: qty 30, 30d supply, fill #0

## 2023-11-11 ENCOUNTER — Other Ambulatory Visit: Payer: Self-pay

## 2023-11-15 ENCOUNTER — Other Ambulatory Visit: Payer: Self-pay

## 2023-11-15 MED ORDER — DOXYCYCLINE HYCLATE 100 MG PO TABS
100.0000 mg | ORAL_TABLET | Freq: Two times a day (BID) | ORAL | 0 refills | Status: AC
  Filled 2023-11-15: qty 14, 7d supply, fill #0

## 2023-11-25 ENCOUNTER — Other Ambulatory Visit: Payer: Self-pay

## 2023-11-26 ENCOUNTER — Other Ambulatory Visit: Payer: Self-pay

## 2023-11-30 ENCOUNTER — Other Ambulatory Visit: Payer: Self-pay

## 2023-11-30 MED FILL — Levothyroxine Sodium Tab 25 MCG: ORAL | 30 days supply | Qty: 15 | Fill #2 | Status: AC

## 2023-12-16 ENCOUNTER — Other Ambulatory Visit: Payer: Self-pay

## 2023-12-20 ENCOUNTER — Other Ambulatory Visit: Payer: Self-pay

## 2023-12-21 ENCOUNTER — Other Ambulatory Visit: Payer: Self-pay

## 2023-12-22 ENCOUNTER — Other Ambulatory Visit: Payer: Self-pay

## 2023-12-24 ENCOUNTER — Other Ambulatory Visit: Payer: Self-pay

## 2023-12-28 ENCOUNTER — Other Ambulatory Visit: Payer: Self-pay

## 2023-12-28 MED ORDER — PREDNISONE 10 MG PO TABS
ORAL_TABLET | ORAL | 0 refills | Status: AC
  Filled 2023-12-28: qty 21, 6d supply, fill #0

## 2023-12-30 ENCOUNTER — Other Ambulatory Visit: Payer: Self-pay

## 2024-01-03 ENCOUNTER — Other Ambulatory Visit: Payer: Self-pay

## 2024-01-04 ENCOUNTER — Other Ambulatory Visit: Payer: Self-pay

## 2024-01-04 MED ORDER — ALPRAZOLAM 0.5 MG PO TABS
0.5000 mg | ORAL_TABLET | Freq: Every evening | ORAL | 5 refills | Status: DC | PRN
  Filled 2024-01-04: qty 30, 30d supply, fill #0
  Filled 2024-01-26 – 2024-02-02 (×2): qty 30, 30d supply, fill #1
  Filled 2024-02-20 – 2024-03-06 (×2): qty 30, 30d supply, fill #2
  Filled 2024-04-04: qty 30, 30d supply, fill #3
  Filled 2024-05-02: qty 30, 30d supply, fill #4
  Filled 2024-06-07: qty 30, 30d supply, fill #5

## 2024-01-06 ENCOUNTER — Other Ambulatory Visit: Payer: Self-pay

## 2024-01-06 MED ORDER — PREDNISONE 10 MG PO TABS
ORAL_TABLET | ORAL | 0 refills | Status: DC
  Filled 2024-01-06: qty 21, 6d supply, fill #0

## 2024-01-18 ENCOUNTER — Other Ambulatory Visit: Payer: Self-pay

## 2024-01-25 ENCOUNTER — Other Ambulatory Visit: Payer: Self-pay

## 2024-01-26 ENCOUNTER — Other Ambulatory Visit: Payer: Self-pay

## 2024-01-26 MED ORDER — TRIAMCINOLONE ACETONIDE 0.5 % EX OINT
1.0000 | TOPICAL_OINTMENT | Freq: Two times a day (BID) | CUTANEOUS | 5 refills | Status: AC
  Filled 2024-01-26: qty 30, 30d supply, fill #0
  Filled 2024-02-20: qty 30, 30d supply, fill #1
  Filled 2024-03-22: qty 30, 30d supply, fill #2
  Filled 2024-05-11: qty 30, 30d supply, fill #3
  Filled 2024-06-19 – 2024-07-02 (×2): qty 30, 30d supply, fill #4
  Filled 2024-07-30 – 2024-09-22 (×2): qty 30, 30d supply, fill #5

## 2024-01-27 ENCOUNTER — Other Ambulatory Visit: Payer: Self-pay

## 2024-02-02 ENCOUNTER — Other Ambulatory Visit: Payer: Self-pay

## 2024-02-04 ENCOUNTER — Other Ambulatory Visit: Payer: Self-pay

## 2024-02-11 ENCOUNTER — Other Ambulatory Visit: Payer: Self-pay

## 2024-02-11 MED ORDER — NYSTATIN 100000 UNIT/GM EX POWD
1.0000 | Freq: Two times a day (BID) | CUTANEOUS | 2 refills | Status: DC
  Filled 2024-02-11: qty 60, 30d supply, fill #0
  Filled 2024-03-07: qty 60, 30d supply, fill #1
  Filled 2024-04-14: qty 60, 30d supply, fill #2

## 2024-02-14 ENCOUNTER — Other Ambulatory Visit: Payer: Self-pay

## 2024-02-15 ENCOUNTER — Other Ambulatory Visit: Payer: Self-pay | Admitting: Internal Medicine

## 2024-02-15 DIAGNOSIS — R1011 Right upper quadrant pain: Secondary | ICD-10-CM

## 2024-02-17 ENCOUNTER — Other Ambulatory Visit: Payer: Self-pay

## 2024-02-17 ENCOUNTER — Ambulatory Visit
Admission: RE | Admit: 2024-02-17 | Discharge: 2024-02-17 | Disposition: A | Discharge status: Home / Self Care | Source: Ambulatory Visit | Attending: Internal Medicine | Admitting: Internal Medicine

## 2024-02-17 DIAGNOSIS — R1011 Right upper quadrant pain: Secondary | ICD-10-CM | POA: Diagnosis present

## 2024-02-20 ENCOUNTER — Other Ambulatory Visit: Payer: Self-pay | Admitting: Nurse Practitioner

## 2024-02-20 ENCOUNTER — Other Ambulatory Visit: Payer: Self-pay

## 2024-02-21 ENCOUNTER — Other Ambulatory Visit: Payer: Self-pay

## 2024-02-22 ENCOUNTER — Other Ambulatory Visit: Payer: Self-pay

## 2024-02-22 MED ORDER — CYANOCOBALAMIN 1000 MCG/ML IJ SOLN
1000.0000 ug | INTRAMUSCULAR | 0 refills | Status: DC
  Filled 2024-04-14: qty 1, 14d supply, fill #0

## 2024-02-22 MED ORDER — CYANOCOBALAMIN 1000 MCG/ML IJ SOLN
1000.0000 ug | INTRAMUSCULAR | 0 refills | Status: DC
  Filled 2024-02-22: qty 1, 14d supply, fill #0

## 2024-02-22 MED ORDER — CYANOCOBALAMIN 1000 MCG/ML IJ SOLN
INTRAMUSCULAR | 3 refills | Status: AC
  Filled 2024-02-22 – 2024-03-22 (×2): qty 2, 28d supply, fill #0
  Filled 2024-04-25: qty 2, 28d supply, fill #1
  Filled 2024-05-18 – 2024-08-30 (×9): qty 2, 28d supply, fill #2
  Filled 2024-09-09 – 2024-09-22 (×2): qty 2, 28d supply, fill #3

## 2024-02-22 MED FILL — Levothyroxine Sodium Tab 25 MCG: ORAL | 30 days supply | Qty: 15 | Fill #0 | Status: CN

## 2024-02-22 MED FILL — Levothyroxine Sodium Tab 50 MCG: ORAL | 90 days supply | Qty: 90 | Fill #0 | Status: CN

## 2024-02-23 ENCOUNTER — Other Ambulatory Visit: Payer: Self-pay

## 2024-02-25 ENCOUNTER — Other Ambulatory Visit: Payer: Self-pay

## 2024-03-01 ENCOUNTER — Other Ambulatory Visit: Payer: Self-pay

## 2024-03-06 ENCOUNTER — Other Ambulatory Visit: Payer: Self-pay

## 2024-03-07 ENCOUNTER — Other Ambulatory Visit: Payer: Self-pay

## 2024-03-16 ENCOUNTER — Other Ambulatory Visit: Payer: Self-pay

## 2024-03-16 MED FILL — Levothyroxine Sodium Tab 25 MCG: ORAL | 30 days supply | Qty: 15 | Fill #0 | Status: AC

## 2024-03-22 ENCOUNTER — Other Ambulatory Visit: Payer: Self-pay

## 2024-03-22 MED ORDER — AZITHROMYCIN 250 MG PO TABS
ORAL_TABLET | ORAL | 0 refills | Status: AC
  Filled 2024-03-22: qty 6, 5d supply, fill #0

## 2024-03-24 ENCOUNTER — Other Ambulatory Visit: Payer: Self-pay

## 2024-03-29 ENCOUNTER — Other Ambulatory Visit: Payer: Self-pay

## 2024-04-04 ENCOUNTER — Other Ambulatory Visit: Payer: Self-pay

## 2024-04-05 ENCOUNTER — Other Ambulatory Visit: Payer: Self-pay | Admitting: Medical Genetics

## 2024-04-14 ENCOUNTER — Other Ambulatory Visit: Payer: Self-pay

## 2024-04-20 ENCOUNTER — Other Ambulatory Visit: Payer: Self-pay

## 2024-04-20 MED FILL — Levothyroxine Sodium Tab 25 MCG: ORAL | 30 days supply | Qty: 15 | Fill #1 | Status: AC

## 2024-04-25 ENCOUNTER — Other Ambulatory Visit: Payer: Self-pay

## 2024-04-28 ENCOUNTER — Other Ambulatory Visit: Payer: Self-pay

## 2024-05-02 ENCOUNTER — Other Ambulatory Visit: Payer: Self-pay

## 2024-05-11 ENCOUNTER — Other Ambulatory Visit: Payer: Self-pay

## 2024-05-11 MED ORDER — CYANOCOBALAMIN 1000 MCG/ML IJ SOLN
1000.0000 ug | INTRAMUSCULAR | 3 refills | Status: AC
  Filled 2024-05-11: qty 10, 140d supply, fill #0
  Filled 2024-05-11 – 2024-05-18 (×2): qty 2, 28d supply, fill #0
  Filled 2024-06-19: qty 2, 28d supply, fill #1
  Filled 2024-07-02 – 2024-07-13 (×2): qty 2, 28d supply, fill #2
  Filled 2024-07-30 – 2024-08-04 (×2): qty 2, 28d supply, fill #3
  Filled 2024-09-09: qty 2, 28d supply, fill #4
  Filled 2024-09-22: qty 2, 28d supply, fill #5

## 2024-05-11 MED ORDER — LEVOFLOXACIN 500 MG PO TABS
500.0000 mg | ORAL_TABLET | Freq: Every day | ORAL | 0 refills | Status: AC
  Filled 2024-05-11: qty 10, 10d supply, fill #0

## 2024-05-11 MED FILL — Levothyroxine Sodium Tab 50 MCG: ORAL | 90 days supply | Qty: 90 | Fill #0 | Status: AC

## 2024-05-12 ENCOUNTER — Other Ambulatory Visit: Payer: Self-pay | Admitting: Internal Medicine

## 2024-05-12 DIAGNOSIS — Z1231 Encounter for screening mammogram for malignant neoplasm of breast: Secondary | ICD-10-CM

## 2024-05-18 ENCOUNTER — Other Ambulatory Visit: Payer: Self-pay

## 2024-05-31 ENCOUNTER — Other Ambulatory Visit: Payer: Self-pay

## 2024-05-31 MED FILL — Levothyroxine Sodium Tab 25 MCG: ORAL | 30 days supply | Qty: 15 | Fill #2 | Status: AC

## 2024-06-02 ENCOUNTER — Other Ambulatory Visit: Payer: Self-pay

## 2024-06-07 ENCOUNTER — Other Ambulatory Visit: Payer: Self-pay

## 2024-06-08 ENCOUNTER — Other Ambulatory Visit: Payer: Self-pay

## 2024-06-12 ENCOUNTER — Other Ambulatory Visit: Payer: Self-pay

## 2024-06-12 MED ORDER — PREDNISONE 10 MG PO TABS
ORAL_TABLET | ORAL | 0 refills | Status: AC
  Filled 2024-06-12: qty 20, 8d supply, fill #0

## 2024-06-15 ENCOUNTER — Other Ambulatory Visit: Payer: Self-pay

## 2024-06-19 ENCOUNTER — Other Ambulatory Visit: Payer: Self-pay | Admitting: Medical Genetics

## 2024-06-19 DIAGNOSIS — Z006 Encounter for examination for normal comparison and control in clinical research program: Secondary | ICD-10-CM

## 2024-06-20 ENCOUNTER — Other Ambulatory Visit: Payer: Self-pay

## 2024-06-22 ENCOUNTER — Ambulatory Visit (INDEPENDENT_AMBULATORY_CARE_PROVIDER_SITE_OTHER)

## 2024-06-22 DIAGNOSIS — L729 Follicular cyst of the skin and subcutaneous tissue, unspecified: Secondary | ICD-10-CM

## 2024-06-22 DIAGNOSIS — Z1283 Encounter for screening for malignant neoplasm of skin: Secondary | ICD-10-CM | POA: Diagnosis not present

## 2024-06-22 DIAGNOSIS — L820 Inflamed seborrheic keratosis: Secondary | ICD-10-CM | POA: Diagnosis not present

## 2024-06-22 DIAGNOSIS — L821 Other seborrheic keratosis: Secondary | ICD-10-CM

## 2024-06-22 DIAGNOSIS — L720 Epidermal cyst: Secondary | ICD-10-CM

## 2024-06-22 DIAGNOSIS — L814 Other melanin hyperpigmentation: Secondary | ICD-10-CM | POA: Diagnosis not present

## 2024-06-22 NOTE — Progress Notes (Signed)
    Subjective   Jennifer Mclaughlin is a 53 y.o. female who presents for the following: Lesion(s) of concern . Patient is established patient   Today patient reports: Area of concern on the scalp.  Areas of concern on the left ear. Area of concern on the back.   Review of Systems:    No other skin or systemic complaints except as noted in HPI or Assessment and Plan.  The following portions of the chart were reviewed this encounter and updated as appropriate: medications, allergies, medical history  Relevant Medical History:  n/a   Objective  Well appearing patient in no apparent distress; mood and affect are within normal limits. Examination was performed of the: Sun Exposed Exam: Scalp, head, eyes, ears, nose, lips, neck, upper extremities, hands, fingers, fingernails  Examination notable for: Lentigo/lentigines: Scattered pigmented macules that are tan to brown in color and are somewhat non-uniform in shape and concentrated in the sun-exposed areas, Seborrheic Keratosis(es): Stuck-on appearing keratotic papule(s) on the trunk, some  irritated with redness, crusting, edema, and/or partial avulsion, Actinic Damage/Elastosis: chronic sun damage: dyspigmentation, telangiectasia, and wrinkling, Milia: Multiple 2-3 mm white papules  - A firm, skin-colored, subcutaneous nodule that is freely movable and has a central punctum located at the left scalp and right cheek  Examination limited by: Clothing and Patient deferred removal     Back Stuck on waxy paps with erythema  Assessment & Plan   SKIN CANCER SCREENING PERFORMED TODAY.  BENIGN SKIN FINDINGS  - Lentigines  - Seborrheic keratoses  - Milia  - Reassurance provided regarding the benign appearance of lesions noted on exam today; no treatment is indicated in the absence of symptoms/changes. - Reinforced importance of photoprotective strategies including liberal and frequent sunscreen use of a broad-spectrum SPF 30 or greater, use of  protective clothing, and sun avoidance for prevention of cutaneous malignancy and photoaging.  Counseled patient on the importance of regular self-skin monitoring as well as routine clinical skin examinations as scheduled.   Subcutaneous cyst, left scalp  - Explained to patient this most likely is consistent with a pilar cyst - Benign, patient reassured   Level of service outlined above   Procedures, orders, diagnosis for this visit:  INFLAMED SEBORRHEIC KERATOSIS Back Symptomatic, irritating, patient would like treated. Destruction of lesion - Back Complexity: simple   Destruction method: cryotherapy   Informed consent: discussed and consent obtained   Timeout:  patient name, date of birth, surgical site, and procedure verified Lesion destroyed using liquid nitrogen: Yes   Region frozen until ice ball extended beyond lesion: Yes   Cryo cycles: 1 or 2. Outcome: patient tolerated procedure well with no complications   Post-procedure details: wound care instructions given     Inflamed seborrheic keratosis -     Destruction of lesion    Return to clinic: Return if symptoms worsen or fail to improve.  I, Emerick Ege, CMA am acting as scribe for Lauraine JAYSON Kanaris, MD.   Documentation: I have reviewed the above documentation for accuracy and completeness, and I agree with the above.  Lauraine JAYSON Kanaris, MD

## 2024-06-22 NOTE — Patient Instructions (Addendum)

## 2024-06-26 ENCOUNTER — Other Ambulatory Visit: Payer: Self-pay

## 2024-06-26 MED ORDER — SYNTHROID 25 MCG PO TABS
25.0000 ug | ORAL_TABLET | ORAL | 4 refills | Status: AC
  Filled 2024-06-26: qty 15, 30d supply, fill #0
  Filled 2024-07-28: qty 15, 30d supply, fill #1
  Filled 2024-08-13 – 2024-08-30 (×2): qty 15, 30d supply, fill #2
  Filled 2024-09-09: qty 15, 30d supply, fill #3
  Filled 2024-09-22: qty 15, 30d supply, fill #4
  Filled 2024-09-26: qty 45, 90d supply, fill #4

## 2024-07-02 ENCOUNTER — Other Ambulatory Visit: Payer: Self-pay

## 2024-07-03 ENCOUNTER — Other Ambulatory Visit: Payer: Self-pay

## 2024-07-03 MED ORDER — ALPRAZOLAM 0.5 MG PO TABS
0.5000 mg | ORAL_TABLET | Freq: Every evening | ORAL | 5 refills | Status: AC | PRN
  Filled 2024-07-06: qty 30, 30d supply, fill #0
  Filled 2024-07-30 – 2024-08-03 (×2): qty 30, 30d supply, fill #1
  Filled 2024-09-03: qty 30, 30d supply, fill #2
  Filled 2024-09-09 – 2024-10-02 (×2): qty 30, 30d supply, fill #3

## 2024-07-04 ENCOUNTER — Other Ambulatory Visit: Payer: Self-pay

## 2024-07-06 ENCOUNTER — Other Ambulatory Visit: Payer: Self-pay

## 2024-07-13 ENCOUNTER — Other Ambulatory Visit: Payer: Self-pay

## 2024-07-13 ENCOUNTER — Ambulatory Visit
Admission: RE | Admit: 2024-07-13 | Discharge: 2024-07-13 | Disposition: A | Discharge status: Home / Self Care | Source: Ambulatory Visit | Attending: Internal Medicine | Admitting: Internal Medicine

## 2024-07-13 DIAGNOSIS — Z1231 Encounter for screening mammogram for malignant neoplasm of breast: Secondary | ICD-10-CM | POA: Diagnosis present

## 2024-07-21 ENCOUNTER — Other Ambulatory Visit: Payer: Self-pay | Admitting: Unknown Physician Specialty

## 2024-07-21 DIAGNOSIS — R221 Localized swelling, mass and lump, neck: Secondary | ICD-10-CM

## 2024-07-26 ENCOUNTER — Inpatient Hospital Stay
Admission: RE | Admit: 2024-07-26 | Discharge: 2024-07-26 | Attending: Unknown Physician Specialty | Admitting: Unknown Physician Specialty

## 2024-07-26 DIAGNOSIS — R221 Localized swelling, mass and lump, neck: Secondary | ICD-10-CM

## 2024-07-26 MED ORDER — IOPAMIDOL (ISOVUE-300) INJECTION 61%
75.0000 mL | Freq: Once | INTRAVENOUS | Status: AC | PRN
  Administered 2024-07-26: 75 mL via INTRAVENOUS

## 2024-07-28 ENCOUNTER — Other Ambulatory Visit: Payer: Self-pay

## 2024-07-30 ENCOUNTER — Other Ambulatory Visit: Payer: Self-pay

## 2024-07-31 ENCOUNTER — Other Ambulatory Visit: Payer: Self-pay

## 2024-07-31 MED ORDER — NYSTATIN 100000 UNIT/GM EX POWD
Freq: Two times a day (BID) | CUTANEOUS | 2 refills | Status: AC
  Filled 2024-07-31: qty 60, 30d supply, fill #0
  Filled 2024-08-30: qty 60, 30d supply, fill #1
  Filled 2024-09-22: qty 60, 30d supply, fill #2

## 2024-08-03 ENCOUNTER — Other Ambulatory Visit: Payer: Self-pay

## 2024-08-10 ENCOUNTER — Other Ambulatory Visit: Payer: Self-pay

## 2024-08-10 MED FILL — Levothyroxine Sodium Tab 50 MCG: ORAL | 90 days supply | Qty: 90 | Fill #1 | Status: AC

## 2024-08-14 ENCOUNTER — Other Ambulatory Visit: Payer: Self-pay

## 2024-08-18 ENCOUNTER — Other Ambulatory Visit: Payer: Self-pay

## 2024-08-25 ENCOUNTER — Other Ambulatory Visit: Payer: Self-pay

## 2024-08-30 ENCOUNTER — Other Ambulatory Visit: Payer: Self-pay

## 2024-09-03 ENCOUNTER — Other Ambulatory Visit: Payer: Self-pay | Admitting: Nurse Practitioner

## 2024-09-04 ENCOUNTER — Other Ambulatory Visit: Payer: Self-pay

## 2024-09-05 ENCOUNTER — Other Ambulatory Visit: Payer: Self-pay

## 2024-09-08 ENCOUNTER — Other Ambulatory Visit: Payer: Self-pay

## 2024-09-08 MED ORDER — PREDNISONE 10 MG PO TABS
ORAL_TABLET | ORAL | 0 refills | Status: AC
  Filled 2024-09-08: qty 20, 8d supply, fill #0

## 2024-09-08 MED ORDER — LEVOFLOXACIN 500 MG PO TABS
500.0000 mg | ORAL_TABLET | Freq: Every day | ORAL | 0 refills | Status: AC
  Filled 2024-09-08: qty 7, 7d supply, fill #0

## 2024-09-10 ENCOUNTER — Other Ambulatory Visit: Payer: Self-pay

## 2024-09-11 ENCOUNTER — Other Ambulatory Visit: Payer: Self-pay

## 2024-09-14 ENCOUNTER — Other Ambulatory Visit
Admission: RE | Admit: 2024-09-14 | Discharge: 2024-09-14 | Disposition: A | Discharge status: Home / Self Care | Source: Ambulatory Visit | Attending: Specialist | Admitting: Specialist

## 2024-09-14 DIAGNOSIS — R0789 Other chest pain: Secondary | ICD-10-CM | POA: Insufficient documentation

## 2024-09-14 LAB — D-DIMER, QUANTITATIVE: D-Dimer, Quant: 2.14 ug{FEU}/mL — ABNORMAL HIGH (ref 0.00–0.50)

## 2024-09-15 ENCOUNTER — Ambulatory Visit
Admission: RE | Admit: 2024-09-15 | Discharge: 2024-09-15 | Disposition: A | Discharge status: Home / Self Care | Source: Ambulatory Visit | Attending: Specialist | Admitting: Specialist

## 2024-09-15 ENCOUNTER — Other Ambulatory Visit: Payer: Self-pay | Admitting: Specialist

## 2024-09-15 DIAGNOSIS — R7989 Other specified abnormal findings of blood chemistry: Secondary | ICD-10-CM | POA: Diagnosis present

## 2024-09-15 MED ORDER — IOHEXOL 350 MG/ML SOLN
75.0000 mL | Freq: Once | INTRAVENOUS | Status: AC | PRN
  Administered 2024-09-15: 75 mL via INTRAVENOUS

## 2024-09-18 ENCOUNTER — Other Ambulatory Visit: Payer: Self-pay

## 2024-09-18 MED ORDER — OMEPRAZOLE 20 MG PO CPDR
20.0000 mg | DELAYED_RELEASE_CAPSULE | Freq: Every day | ORAL | 3 refills | Status: AC
  Filled 2024-09-18: qty 30, 30d supply, fill #0

## 2024-09-21 ENCOUNTER — Other Ambulatory Visit: Payer: Self-pay | Admitting: Internal Medicine

## 2024-09-21 DIAGNOSIS — R1011 Right upper quadrant pain: Secondary | ICD-10-CM

## 2024-09-22 ENCOUNTER — Ambulatory Visit
Admission: RE | Admit: 2024-09-22 | Discharge: 2024-09-22 | Disposition: A | Source: Ambulatory Visit | Attending: Internal Medicine | Admitting: Internal Medicine

## 2024-09-22 ENCOUNTER — Other Ambulatory Visit: Payer: Self-pay

## 2024-09-22 DIAGNOSIS — R1011 Right upper quadrant pain: Secondary | ICD-10-CM | POA: Insufficient documentation

## 2024-09-22 MED ORDER — LEVOTHYROXINE SODIUM 50 MCG PO TABS
50.0000 ug | ORAL_TABLET | ORAL | 3 refills | Status: AC
  Filled 2024-09-22: qty 45, 90d supply, fill #0

## 2024-09-23 ENCOUNTER — Other Ambulatory Visit: Payer: Self-pay

## 2024-09-25 ENCOUNTER — Other Ambulatory Visit: Payer: Self-pay

## 2024-09-26 ENCOUNTER — Other Ambulatory Visit: Payer: Self-pay

## 2024-09-27 ENCOUNTER — Other Ambulatory Visit (HOSPITAL_COMMUNITY): Payer: Self-pay

## 2024-10-02 ENCOUNTER — Other Ambulatory Visit: Payer: Self-pay
